# Patient Record
Sex: Female | Born: 2000
Health system: Southern US, Community
[De-identification: ages and names within clinical notes are randomized; demographics above are authoritative.]

## PROBLEM LIST (undated history)

## (undated) DIAGNOSIS — F419 Anxiety disorder, unspecified: Secondary | ICD-10-CM

## (undated) DIAGNOSIS — K219 Gastro-esophageal reflux disease without esophagitis: Secondary | ICD-10-CM

## (undated) DIAGNOSIS — L732 Hidradenitis suppurativa: Secondary | ICD-10-CM

## (undated) DIAGNOSIS — F319 Bipolar disorder, unspecified: Secondary | ICD-10-CM

## (undated) DIAGNOSIS — I10 Essential (primary) hypertension: Secondary | ICD-10-CM

## (undated) DIAGNOSIS — F32A Depression, unspecified: Secondary | ICD-10-CM

## (undated) DIAGNOSIS — E282 Polycystic ovarian syndrome: Secondary | ICD-10-CM

## (undated) DIAGNOSIS — K589 Irritable bowel syndrome without diarrhea: Secondary | ICD-10-CM

## (undated) DIAGNOSIS — F4310 Post-traumatic stress disorder, unspecified: Secondary | ICD-10-CM

## (undated) DIAGNOSIS — F603 Borderline personality disorder: Secondary | ICD-10-CM

## (undated) DIAGNOSIS — F329 Major depressive disorder, single episode, unspecified: Secondary | ICD-10-CM

## (undated) DIAGNOSIS — D649 Anemia, unspecified: Secondary | ICD-10-CM

## (undated) HISTORY — DX: Irritable bowel syndrome, unspecified: K58.9

## (undated) HISTORY — DX: Post-traumatic stress disorder, unspecified: F43.10

## (undated) HISTORY — DX: Gastro-esophageal reflux disease without esophagitis: K21.9

## (undated) HISTORY — DX: Anxiety disorder, unspecified: F41.9

## (undated) HISTORY — DX: Anemia, unspecified: D64.9

## (undated) HISTORY — DX: Hidradenitis suppurativa: L73.2

## (undated) HISTORY — PX: OTHER SURGICAL HISTORY: SHX169

---
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
## 2018-02-20 ENCOUNTER — Telehealth: Payer: No Typology Code available for payment source | Admitting: Family

## 2018-02-20 DIAGNOSIS — H60501 Unspecified acute noninfective otitis externa, right ear: Secondary | ICD-10-CM | POA: Diagnosis not present

## 2018-02-20 MED ORDER — CIPROFLOXACIN-DEXAMETHASONE 0.3-0.1 % OT SUSP: 4.0000 [drp] | Freq: Two times a day (BID) | OTIC | 0 refills | Status: DC

## 2018-02-20 NOTE — Progress Notes (Signed)
E Visit for Swimmer's Ear  We are sorry that you are not feeling well. Here is how we plan to help!  Based on what you have shared with me it looks like you have swimmers ear. Swimmer's ear is a redness or swelling, irritation, or infection of your outer ear canal.  These symptoms usually occur within a few days of swimming.  Your ear canal is a tube that goes from the opening of the ear to the eardrum.  When water stays in your ear canal, germs can grow.  This is a painful condition that often happens to children and swimmers of all ages.  It is not contagious and oral antibiotics are not required to treat uncomplicated swimmer's ear.  The usual symptoms include: Itching inside the ear, Redness or a sense of swelling in the ear, Pain when the ear is tugged on when pressure is placed on the ear, Pus draining from the infected ear. and I have prescribed: Ciprofloxin 0.2% and dexamethasone 1% otic suspension 4 drops in affected ears twice daily for 7 days    In certain cases swimmer's ear may progress to a more serious bacterial infection of the middle or inner ear.  If you have a fever 102 and up and significantly worsening symptoms, this could indicate a more serious infection moving to the middle/inner and needs face to face evaluation in an office by a provider.  Your symptoms should improve over the next 3 days and should resolve in about 7 days.  HOME CARE:   Wash your hands frequently.  Do not place the tip of the bottle on your ear or touch it with your fingers.  You can take Acetominophen 650 mg every 4-6 hours as needed for pain.  If pain is severe or moderate, you can apply a heating pad (set on low) or hot water bottle (wrapped in a towel) to outer ear for 20 minutes.  This will also increase drainage.  Avoid ear plugs  Do not use Q-tips  After showers, help the water run out by tilting your head to one side.  GET HELP RIGHT AWAY IF:   Fever is over 102.2 degrees.  You  develop progressive ear pain or hearing loss.  Ear symptoms persist longer than 3 days after treatment.  MAKE SURE YOU:   Understand these instructions.  Will watch your condition.  Will get help right away if you are not doing well or get worse.  TO PREVENT SWIMMER'S EAR:  Use a bathing cap or custom fitted swim molds to keep your ears dry.  Towel off after swimming to dry your ears.  Tilt your head or pull your earlobes to allow the water to escape your ear canal.  If there is still water in your ears, consider using a hairdryer on the lowest setting.  Thank you for choosing an e-visit. Your e-visit answers were reviewed by a board certified advanced clinical practitioner to complete your personal care plan. Depending upon the condition, your plan could have included both over the counter or prescription medications. Please review your pharmacy choice. Be sure that the pharmacy you have chosen is open so that you can pick up your prescription now.  If there is a problem you may message your provider in [place] to have the prescription routed to another pharmacy. Your safety is important to [place]. If you have drug allergies check your prescription carefully.  For the next 24 hours, you can use MyChart to ask questions about  today's visit, request a non-urgent call back, or ask for a work or school excuse from your e-visit provider. You will get an email in the next two days asking about your experience. I hope that your e-visit has been valuable and will speed your recovery.

## 2018-03-02 ENCOUNTER — Telehealth: Payer: Medicaid Other | Admitting: Physician Assistant

## 2018-03-02 DIAGNOSIS — R112 Nausea with vomiting, unspecified: Secondary | ICD-10-CM

## 2018-03-02 DIAGNOSIS — R1013 Epigastric pain: Secondary | ICD-10-CM

## 2018-03-02 NOTE — Progress Notes (Signed)
Based on what you shared with me it looks like you have a serious condition that should be evaluated in a face to face office visit.  NOTE: If you entered your credit card information for this eVisit, you will not be charged. You may see a "hold" on your card for the $30 but that hold will drop off and you will not have a charge processed.  If you are having a true medical emergency please call 911.  If you need an urgent face to face visit, [facility] has four urgent care centers for your convenience.  If you need care fast and have a high deductible or no insurance consider:   [email or web address].[email or web address] to reserve your spot online an avoid wait times  [facility] [address], [address] [place], [place] [address] 8 am to 8 pm Monday-Friday 10 am to 4 pm Saturday-Sunday *Across the street from [employer]  [facility]  [address]  [place], [address] 8 am to 5 pm Monday-Friday * In the [facility]   The following sites will take your  insurance:  . [facility]  [phone] Get Driving Directions Find a Provider at this Location  [address] [place], [place] [address] . 10 am to 8 pm Monday-Friday . 12 pm to 8 pm Saturday-Sunday   . [facility]  [phone] Get Driving Directions Find a Provider at this Location  [address], [address] [place], [place] [address] . 8 am to 8 pm Monday-Friday . 9 am to 6 pm Saturday . 11 am to 6 pm Sunday   . [facility] [place]  [phone] Get Driving Directions  [address] [place], [place] [address] . 8 am to 8 pm Monday-Friday . 8 am to 4 pm Saturday-Sunday   Your e-visit answers were reviewed by a board certified advanced clinical practitioner to complete your personal care plan.  Thank you for using e-Visits.  

## 2018-03-02 NOTE — Progress Notes (Signed)
I have spent 5 minutes in reviewing chart, decision making and responding to patient.

## 2018-08-17 ENCOUNTER — Other Ambulatory Visit: Payer: Self-pay

## 2018-08-17 ENCOUNTER — Encounter: Payer: Self-pay | Admitting: *Deleted

## 2018-08-17 ENCOUNTER — Ambulatory Visit
Admission: EM | Admit: 2018-08-17 | Discharge: 2018-08-17 | Disposition: A | Discharge status: Home / Self Care | Payer: No Typology Code available for payment source | Attending: Emergency Medicine | Admitting: Emergency Medicine

## 2018-08-17 DIAGNOSIS — R591 Generalized enlarged lymph nodes: Secondary | ICD-10-CM | POA: Diagnosis not present

## 2018-08-17 DIAGNOSIS — J029 Acute pharyngitis, unspecified: Secondary | ICD-10-CM | POA: Diagnosis present

## 2018-08-17 DIAGNOSIS — R6889 Other general symptoms and signs: Secondary | ICD-10-CM | POA: Diagnosis present

## 2018-08-17 DIAGNOSIS — Z20822 Contact with and (suspected) exposure to covid-19: Secondary | ICD-10-CM

## 2018-08-17 DIAGNOSIS — Z20828 Contact with and (suspected) exposure to other viral communicable diseases: Secondary | ICD-10-CM

## 2018-08-17 DIAGNOSIS — R1031 Right lower quadrant pain: Secondary | ICD-10-CM | POA: Diagnosis not present

## 2018-08-17 LAB — POCT RAPID STREP A (OFFICE): Rapid Strep A Screen: NEGATIVE

## 2018-08-17 MED ORDER — AMOXICILLIN 500 MG PO CAPS: 500.0000 mg | ORAL_CAPSULE | Freq: Two times a day (BID) | ORAL | 0 refills | Status: AC

## 2018-08-17 MED ORDER — FLUCONAZOLE 200 MG PO TABS: ORAL_TABLET | ORAL | 0 refills | Status: DC

## 2018-08-17 NOTE — Discharge Instructions (Addendum)
Strep test negative, will send out for culture and we will call you with results Push fluids and get rest Prescribed amoxicillin 500mg  twice daily for 10 days.  Take as directed and to completion.  Drink warm or cool liquids, use throat lozenges, or popsicles to help alleviate symptoms Take OTC ibuprofen or tylenol as needed for pain  In the meantime: You should remain isolated in your home for 7 days from symptom onset AND greater than 72 hours after symptoms resolution (absence of fever without the use of fever-reducing medication and improvement in respiratory symptoms), whichever is longer Call or go to the ED if you have any new or worsening symptoms such as fever, worsening cough, shortness of breath, chest tightness, chest pain, turning blue, changes in mental status, etc..Marland Kitchen

## 2018-08-17 NOTE — ED Triage Notes (Signed)
Reports starting with knot in right neck over past 2 days with slight soreness.  Denies sore throat.  Has been taking IBU.  Denies fevers. States gets tested Q 2 wks for Covid for job; had most recent swab done 2 days ago.

## 2018-08-17 NOTE — ED Provider Notes (Addendum)
[facility]   [id number] 08/17/18 Arrival Time: 8469  CC: SORE THROAT  SUBJECTIVE: History from: patient.  Cynthia Ross is a 18 y.o. female who presents with sore throat, and RT sided LAD x 1-2 days.  Denies sick exposure to COVID, flu or strep.  Denies recent travel.  Works with the [employer].  Has tried OTC antihistamine with minimal relief.  Symptoms are made worse with swallowing, but tolerating liquids and own secretions without difficulty.  Reports previous symptoms in the past with strep.   Denies fever, chills, fatigue, ear pain, sinus pain, rhinorrhea, nasal congestion, cough, SOB, wheezing, chest pain, chest pressure, nausea, rash, changes in bowel or bladder habits.    ROS: As per HPI.  All other pertinent ROS negative.     Past Medical History:  Diagnosis Date  . Anxiety   . Depression   . GERD (gastroesophageal reflux disease)   . IBS (irritable bowel syndrome)    Past Surgical History:  Procedure Laterality Date  . None     Allergies  Allergen Reactions  . Other     Pt cannot remember which medicine makes her itch   No current facility-administered medications on file prior to encounter.    Current Outpatient Medications on File Prior to Encounter  Medication Sig Dispense Refill  . DIPHENHYDRAMINE CITRATE PO Take by mouth.    . famotidine (PEPCID) 20 MG tablet Take 20 mg by mouth daily.    Marland Kitchen LORazepam (ATIVAN) 1 MG tablet Take 1 mg by mouth 2 (two) times daily.    . ciprofloxacin-dexamethasone (CIPRODEX) OTIC suspension Place 4 drops into the right ear 2 (two) times daily. 7.5 mL 0  . hydrOXYzine (ATARAX/VISTARIL) 25 MG tablet Take 25 mg by mouth at bedtime.     Marland Kitchen ibuprofen (ADVIL,MOTRIN) 600 MG tablet Take 1 tablet (600 mg total) by mouth 4 (four) times daily. 30 tablet 0  . norgestimate-ethinyl estradiol (SPRINTEC 28) 0.25-35 MG-MCG tablet Take 1 tablet by mouth daily. 84 tablet 4  . pantoprazole (PROTONIX) 40 MG tablet Take 40 mg by mouth  at bedtime.    . [DISCONTINUED] hyoscyamine (LEVSIN, ANASPAZ) 0.125 MG tablet Take 1 tablet (0.125 mg total) by mouth every 6 (six) hours as needed. (Patient not taking: Reported on 12/16/2017) 30 tablet 1   Social History   Socioeconomic History  . Marital status: Single    Spouse name: Not on file  . Number of children: Not on file  . Years of education: Not on file  . Highest education level: Not on file  Occupational History  . Not on file  Social Needs  . Financial resource strain: Not on file  . Food insecurity    Worry: Not on file    Inability: Not on file  . Transportation needs    Medical: Not on file    Non-medical: Not on file  Tobacco Use  . Smoking status: Never Smoker  . Smokeless tobacco: Never Used  Substance and Sexual Activity  . Alcohol use: No  . Drug use: No  . Sexual activity: Yes    Birth control/protection: None  Lifestyle  . Physical activity    Days per week: Not on file    Minutes per session: Not on file  . Stress: Not on file  Relationships  . Social Herbalist on phone: Not on file    Gets together: Not on file    Attends religious service: Not on file  Active member of club or organization: Not on file    Attends meetings of clubs or organizations: Not on file    Relationship status: Not on file  . Intimate partner violence    Fear of current or ex partner: Not on file    Emotionally abused: Not on file    Physically abused: Not on file    Forced sexual activity: Not on file  Other Topics Concern  . Not on file  Social History Narrative  . Not on file   Family History  Problem Relation Age of Onset  . Depression Father   . Irritable bowel syndrome Father   . Mental illness Mother   . Cancer Mother        ovary  . Endometriosis Mother   . COPD Maternal Grandmother   . Cancer Maternal Grandmother        ovary  . Colon cancer Neg Hx     OBJECTIVE:  Vitals:   08/17/18 1446  BP: 129/78  Pulse: (!) 112   Resp: 16  Temp: 97.9 F (36.6 C)  TempSrc: Temporal  SpO2: 97%    General appearance: alert; well-appearing, nontoxic, speaking in full sentences and managing own secretions HEENT: NCAT; Ears: EACs clear, TMs pearly gray with visible cone of light, without erythema; Eyes: PERRL, EOMI grossly; Nose: no obvious rhinorrhea; Throat: oropharynx clear, tonsils 1+ and erythematous without white tonsillar exudates, uvula midline Neck:  Lungs: CTA bilaterally without adventitious breath sounds; cough absent Heart: regular rate and rhythm.   Skin: warm and dry Psychological: alert and cooperative; normal mood and affect  LABS: Results for orders placed or performed during the hospital encounter of 08/17/18 (from the past 24 hour(s))  POCT rapid strep A     Status: None   Collection Time: 08/17/18  3:20 PM  Result Value Ref Range   Rapid Strep A Screen Negative Negative    ASSESSMENT & PLAN:  1. Acute pharyngitis, unspecified etiology   2. Lymphadenopathy   3. Suspected Covid-19 Virus Infection     Meds ordered this encounter  Medications  . amoxicillin (AMOXIL) 500 MG capsule    Sig: Take 1 capsule (500 mg total) by mouth 2 (two) times daily for 10 days.    Dispense:  20 capsule    Refill:  0    Order Specific Question:   Supervising Provider    Answer:   Raylene Everts [[id number]]  . fluconazole (DIFLUCAN) 200 MG tablet    Sig: Take one dose by mouth, wait 72 hours, and then take second dose by mouth    Dispense:  2 tablet    Refill:  0    Order Specific Question:   Supervising Provider    Answer:   Raylene Everts [[id number]]   Strep test negative, will send out for culture and we will call you with results.  However, based on exam and symptoms will go ahead and treat for strep.   Push fluids and get rest Prescribed amoxicillin 500mg  twice daily for 10 days.  Take as directed and to completion.  Drink warm or cool liquids, use throat lozenges, or popsicles to help alleviate  symptoms Take OTC ibuprofen or tylenol as needed for pain  In the meantime: You should remain isolated in your home for 7 days from symptom onset AND greater than 72 hours after symptoms resolution (absence of fever without the use of fever-reducing medication and improvement in respiratory symptoms), whichever is longer Call or go  to the ED if you have any new or worsening symptoms such as fever, worsening cough, shortness of breath, chest tightness, chest pain, turning blue, changes in mental status, etc...   Reviewed expectations re: course of current medical issues. Questions answered. Outlined signs and symptoms indicating need for more acute intervention. Patient verbalized understanding. After Visit Summary given.        Lestine Box, PA-C 08/17/18 Byron, Dawson, PA-C 08/17/18 1545

## 2018-08-20 LAB — CULTURE, GROUP A STREP (THRC)

## 2018-09-11 ENCOUNTER — Encounter: Payer: Self-pay | Admitting: General Surgery

## 2018-09-15 ENCOUNTER — Telehealth: Payer: No Typology Code available for payment source | Admitting: Physician Assistant

## 2018-09-15 DIAGNOSIS — B349 Viral infection, unspecified: Secondary | ICD-10-CM | POA: Diagnosis not present

## 2018-09-15 MED ORDER — BENZONATATE 100 MG PO CAPS: 100.0000 mg | ORAL_CAPSULE | Freq: Two times a day (BID) | ORAL | 0 refills | Status: DC | PRN

## 2018-09-15 NOTE — Progress Notes (Addendum)
We are sorry you are not feeling well.  Here is how we plan to help!  Based on what you have shared with me, it looks like you may have a viral infection. Viral infections are caused by a large number of viruses; however, rhinovirus is the most common cause.   Symptoms vary from person to person, with common symptoms including sore throat, cough, and fatigue or lack of energy.  A low-grade fever of up to 100.4 may present, but is often uncommon.  Symptoms vary however, and are closely related to a person's age or underlying illnesses.  The most common symptoms associated with an upper respiratory infection are nasal discharge or congestion, cough, sneezing, headache and pressure in the ears and face.  These symptoms usually persist for about 3 to 10 days, but can last up to 2 weeks.  It is important to know that upper respiratory infections do not cause serious illness or complications in most cases.    Upper respiratory infections can be transmitted from person to person, with the most common method of transmission being a person's hands.  The virus is able to live on the skin and can infect other persons for up to 2 hours after direct contact.  Also, these can be transmitted when someone coughs or sneezes; thus, it is important to cover the mouth to reduce this risk.  To keep the spread of the illness at [place], good hand hygiene is very important.  This is an infection that is most likely caused by a virus. There are no specific treatments other than to help you with the symptoms until the infection runs its course.  We are sorry you are not feeling well.  Here is how we plan to help!   For nasal congestion, you may use an oral decongestants such as Mucinex D or if you have glaucoma or high blood pressure use plain Mucinex.  Saline nasal spray or nasal drops can help and can safely be used as often as needed for congestion.  For your congestion please use saline nasal rinses. I have sent a prescription  for Tessalon to your pharmacy for cough as needed. You may continue to use over the counter medications as directed.  If you do not have a history of heart disease, hypertension, diabetes or thyroid disease, prostate/bladder issues or glaucoma, you may also use Sudafed to treat nasal congestion.  It is highly recommended that you consult with a pharmacist or your primary care physician to ensure this medication is safe for you to take.     If you have a cough, you may use cough suppressants such as Delsym and Robitussin.  If you have glaucoma or high blood pressure, you can also use Coricidin HBP.   For cough I have prescribed for you A prescription cough medication called Tessalon Perles 100 mg. You may take 1-2 capsules every 8 hours as needed for cough  If you have a sore or scratchy throat, use a saltwater gargle-  to  teaspoon of salt dissolved in a 4-ounce to 8-ounce glass of warm water.  Gargle the solution for approximately 15-30 seconds and then spit.  It is important not to swallow the solution.  You can also use throat lozenges/cough drops and Chloraseptic spray to help with throat pain or discomfort.  Warm or cold liquids can also be helpful in relieving throat pain.  For headache, pain or general discomfort, you can use Ibuprofen or Tylenol as directed.   Some authorities  believe that zinc sprays or the use of Echinacea may shorten the course of your symptoms.   HOME CARE . Only take medications as instructed by your medical team. . Be sure to drink plenty of fluids. Water is fine as well as fruit juices, sodas and electrolyte beverages. You may want to stay away from caffeine or alcohol. If you are nauseated, try taking small sips of liquids. How do you know if you are getting enough fluid? Your urine should be a pale yellow or almost colorless. . Get rest. . Taking a steamy shower or using a humidifier may help nasal congestion and ease sore throat pain. You can place a towel over  your head and breathe in the steam from hot water coming from a faucet. . Using a saline nasal spray works much the same way. . Cough drops, hard candies and sore throat lozenges may ease your cough. . Avoid close contacts especially the very young and the elderly . Cover your mouth if you cough or sneeze . Always remember to wash your hands.   GET HELP RIGHT AWAY IF: . You develop worsening fever. . If your symptoms do not improve within 10 days . You develop yellow or green discharge from your nose over 3 days. . You have coughing fits . You develop a severe head ache or visual changes. . You develop shortness of breath, difficulty breathing or start having chest pain . Your symptoms persist after you have completed your treatment plan  MAKE SURE YOU   Understand these instructions.  Will watch your condition.  Will get help right away if you are not doing well or get worse.  Your e-visit answers were reviewed by a board certified advanced clinical practitioner to complete your personal care plan. Depending upon the condition, your plan could have included both over the counter or prescription medications. Please review your pharmacy choice. If there is a problem, you may call our nursing hot line at and have the prescription routed to another pharmacy. Your safety is important to [place]. If you have drug allergies check your prescription carefully.   You can use MyChart to ask questions about today's visit, request a non-urgent call back, or ask for a work or school excuse for 24 hours related to this e-Visit. If it has been greater than 24 hours you will need to follow up with your provider, or enter a new e-Visit to address those concerns. You will get an e-mail in the next two days asking about your experience.  I hope that your e-visit has been valuable and will speed your recovery. Thank you for using e-visits.    Greater than 5 minutes, yet less than 10 minutes of time have been  spent researching, coordinating, and implementing care for this patient today

## 2018-10-24 ENCOUNTER — Ambulatory Visit: Payer: No Typology Code available for payment source | Admitting: General Surgery

## 2019-12-04 ENCOUNTER — Emergency Department (HOSPITAL_COMMUNITY)
Admission: EM | Admit: 2019-12-04 | Discharge: 2019-12-04 | Disposition: A | Discharge status: Home / Self Care | Payer: Self-pay | Attending: Emergency Medicine | Admitting: Emergency Medicine

## 2019-12-04 ENCOUNTER — Encounter (HOSPITAL_COMMUNITY): Payer: Self-pay

## 2019-12-04 ENCOUNTER — Emergency Department (HOSPITAL_COMMUNITY): Payer: Self-pay

## 2019-12-04 ENCOUNTER — Other Ambulatory Visit: Payer: Self-pay

## 2019-12-04 DIAGNOSIS — R079 Chest pain, unspecified: Secondary | ICD-10-CM | POA: Insufficient documentation

## 2019-12-04 DIAGNOSIS — R202 Paresthesia of skin: Secondary | ICD-10-CM | POA: Insufficient documentation

## 2019-12-04 DIAGNOSIS — Z79899 Other long term (current) drug therapy: Secondary | ICD-10-CM | POA: Insufficient documentation

## 2019-12-04 DIAGNOSIS — I10 Essential (primary) hypertension: Secondary | ICD-10-CM | POA: Insufficient documentation

## 2019-12-04 DIAGNOSIS — F17210 Nicotine dependence, cigarettes, uncomplicated: Secondary | ICD-10-CM | POA: Insufficient documentation

## 2019-12-04 HISTORY — DX: Essential (primary) hypertension: I10

## 2019-12-04 LAB — COMPREHENSIVE METABOLIC PANEL
ALT: 17 U/L (ref 0–44)
AST: 20 U/L (ref 15–41)
Albumin: 4.6 g/dL (ref 3.5–5.0)
Alkaline Phosphatase: 49 U/L (ref 38–126)
Anion gap: 9 (ref 5–15)
BUN: 10 mg/dL (ref 6–20)
CO2: 23 mmol/L (ref 22–32)
Calcium: 9.1 mg/dL (ref 8.9–10.3)
Chloride: 104 mmol/L (ref 98–111)
Creatinine, Ser: 0.6 mg/dL (ref 0.44–1.00)
GFR, Estimated: >60 mL/min (ref >60)
Glucose, Bld: 93 mg/dL (ref 70–99)
Potassium: 3.3 mmol/L — ABNORMAL LOW (ref 3.5–5.1)
Sodium: 136 mmol/L (ref 135–145)
Total Bilirubin: 0.4 mg/dL (ref 0.3–1.2)
Total Protein: 7.8 g/dL (ref 6.5–8.1)

## 2019-12-04 LAB — CBC WITH DIFFERENTIAL/PLATELET
Abs Immature Granulocytes: 0.03 10*3/uL (ref 0.00–0.07)
Basophils Absolute: 0 10*3/uL (ref 0.0–0.1)
Basophils Relative: 1 %
Eosinophils Absolute: 0.1 10*3/uL (ref 0.0–0.5)
Eosinophils Relative: 2 %
HCT: 40.7 % (ref 36.0–46.0)
Hemoglobin: 13.6 g/dL (ref 12.0–15.0)
Immature Granulocytes: 0 %
Lymphocytes Relative: 26 %
Lymphs Abs: 2.1 10*3/uL (ref 0.7–4.0)
MCH: 27.8 pg (ref 26.0–34.0)
MCHC: 33.4 g/dL (ref 30.0–36.0)
MCV: 83.2 fL (ref 80.0–100.0)
Monocytes Absolute: 0.5 10*3/uL (ref 0.1–1.0)
Monocytes Relative: 6 %
Neutro Abs: 5.4 10*3/uL (ref 1.7–7.7)
Neutrophils Relative %: 65 %
Platelets: 223 10*3/uL (ref 150–400)
RBC: 4.89 MIL/uL (ref 3.87–5.11)
RDW: 12.3 % (ref 11.5–15.5)
WBC: 8.1 10*3/uL (ref 4.0–10.5)
nRBC: 0 % (ref 0.0–0.2)

## 2019-12-04 MED ORDER — POTASSIUM CHLORIDE CRYS ER 20 MEQ PO TBCR
20.0000 meq | EXTENDED_RELEASE_TABLET | Freq: Once | ORAL | Status: AC
  Administered 2019-12-04: 20 meq via ORAL
  Filled 2019-12-04: qty 1

## 2019-12-04 NOTE — ED Notes (Signed)
Pt ambulated to restroom and back to room.

## 2019-12-04 NOTE — Discharge Instructions (Signed)
Your history, physical exam, and work-up today was entirely reassuring.  I have very low suspicion for a cardiac event today.  Your chest pain is nonspecific, perhaps related to reflux.  I recommend that you take a Gas-X or Tums the next time you experience similar symptoms.  As for your tingling/paresthesias sensation, please read the attachment.  I suspect in your case that there is an anxiety component, however given your restricted diet, perhaps there is some vitamin derangement such as vitamin B6.  I encourage you to eat a more expensive diet.  It is vitally important that you get established with a primary care provider for ongoing evaluation and management of wellbeing.  I feel as though your mental health is being under addressed.  Please call the [facility] to get established with a primary care provider.  You may also go to the Department of Health locally in [place] or [place].  I have also provided an attachment on local counseling options for people with mental health disease.  Please review it.     Return to the ER seek immediate medical attention should you experience any new or worsening symptoms.

## 2019-12-04 NOTE — ED Provider Notes (Signed)
[facility] EMERGENCY DEPARTMENT Provider Note   CSN: [id number] Arrival date & time: 12/04/19  1930     History Chief Complaint  Patient presents with  . Chest Pain    Cynthia Ross is a 19 y.o. female with PMH of GERD, anxiety, and depression who presents the ED with complaints of numbness and chest pain.  On my examination, patient tells me that she has had nothing to eat for the past few weeks aside from [place] fries.  She tells me she has problems with her mental health and is not receiving any outpatient treatment.  She states that she had a pediatrician that has had her on benzodiazepines since she was 19 years old.  She does not take any other medications regularly.  She states that she no longer has any insurance and has not seen a psychiatrist or other mental health professional since she was a child.  Today, she feels as though her left arm has been tingly, cold, and "heavy".  She also has been having chest pain intermittently throughout the day.  She was told by her old pediatrician that she was borderline hyperlipidemic and might need to be placed on statin medications.  She is concerned that perhaps given her recent diet that she is having a heart attack.  She states that she would not be able to sleep until she was evaluated in the ER.  Her grandmother encouraged to come in for assessment.  She was at work when her chest pain began.  She works as a Scientist, water quality.  She denies any exertional chest pain, shortness of breath, recent illness or infection, recent fevers or chills, nausea or vomiting, diaphoresis, family history of premature cardiac death, or other symptoms.  She smokes a cigarette, every once in a while, but nothing study.  No other illicit drug use.  She denies any history of IVDA.  She also denies possibility of pregnancy.  HPI     Past Medical History:  Diagnosis Date  . Anxiety   . Depression   . GERD (gastroesophageal reflux disease)   . Hypertension   . IBS  (irritable bowel syndrome)     Patient Active Problem List   Diagnosis Date Noted  . Abdominal pain of multiple sites 03/11/2017  . Irritable bowel syndrome with both constipation and diarrhea 12/14/2016  . Dyspareunia in female 12/14/2016  . Dysmenorrhea 12/14/2016  . Yeast infection of the vagina 12/14/2016  . Vaginal discharge 12/14/2016  . Depression 09/22/2012  . Generalized anxiety disorder 09/22/2012  . Family history of bipolar disorder 09/22/2012  . Family history of depression 09/22/2012  . Lice 26/71/2458  . Dyspepsia 09/08/2012  . GERD (gastroesophageal reflux disease) 09/08/2012    Past Surgical History:  Procedure Laterality Date  . None       OB History    Gravida  0   Para  0   Term  0   Preterm  0   AB  0   Living  0     SAB  0   TAB  0   Ectopic  0   Multiple  0   Live Births  0           Family History  Problem Relation Age of Onset  . Depression Father   . Irritable bowel syndrome Father   . Mental illness Mother   . Cancer Mother        ovary  . Endometriosis Mother   . COPD Maternal  Grandmother   . Cancer Maternal Grandmother        ovary  . Colon cancer Neg Hx     Social History   Tobacco Use  . Smoking status: Current Every Day Smoker    Packs/day: 1.00    Types: Cigarettes  . Smokeless tobacco: Never Used  Vaping Use  . Vaping Use: Never used  Substance Use Topics  . Alcohol use: Yes    Comment: 4 days a week  . Drug use: Yes    Types: Marijuana    Home Medications Prior to Admission medications   Medication Sig Start Date End Date Taking? Authorizing Provider  loperamide (IMODIUM A-D) 2 MG tablet Take 2 mg by mouth 4 (four) times daily as needed for diarrhea or loose stools.   Yes [provider]  LORazepam (ATIVAN) 1 MG tablet Take 1 mg by mouth 2 (two) times daily.   Yes [provider]  ondansetron (ZOFRAN) 4 MG tablet Take 4 mg by mouth 3 (three) times daily as needed. 11/09/19   Yes [provider]  Simethicone (GAS-X PO) Take 1 tablet by mouth daily.   Yes [provider]  benzonatate (TESSALON) 100 MG capsule Take 1 capsule (100 mg total) by mouth 2 (two) times daily as needed for cough. 09/15/18   Hedges, Dellis Filbert, PA-C  ciprofloxacin-dexamethasone (CIPRODEX) OTIC suspension Place 4 drops into the right ear 2 (two) times daily. 02/20/18   Sharion Balloon, FNP  DIPHENHYDRAMINE CITRATE PO Take by mouth.    [provider]  fluconazole (DIFLUCAN) 200 MG tablet Take one dose by mouth, wait 72 hours, and then take second dose by mouth 08/17/18   Wurst, [place], PA-C  hydrOXYzine (ATARAX/VISTARIL) 25 MG tablet Take 25 mg by mouth at bedtime.     [provider]  ibuprofen (ADVIL,MOTRIN) 600 MG tablet Take 1 tablet (600 mg total) by mouth 4 (four) times daily. 10/05/17   Lily Kocher, PA-C  norgestimate-ethinyl estradiol (SPRINTEC 28) 0.25-35 MG-MCG tablet Take 1 tablet by mouth daily. 12/16/17   Estill Dooms, NP  pantoprazole (PROTONIX) 40 MG tablet Take 40 mg by mouth at bedtime.    [provider]  hyoscyamine (LEVSIN, ANASPAZ) 0.125 MG tablet Take 1 tablet (0.125 mg total) by mouth every 6 (six) hours as needed. Patient not taking: Reported on 12/16/2017 12/14/16 08/17/18  Estill Dooms, NP    Allergies    Other  Review of Systems   Review of Systems  All other systems reviewed and are negative.   Physical Exam Updated Vital Signs BP (!) 126/49 (BP Location: Right Arm)   Pulse 87   Temp 98.4 F (36.9 C) (Oral)   Resp 18   Ht 5\' 3"  (1.6 m)   Wt 83.9 kg   LMP 11/20/2019 (Within Days)   SpO2 99%   BMI 32.77 kg/m   Physical Exam Vitals and nursing note reviewed. Exam conducted with a chaperone present.  Constitutional:      General: She is not in acute distress.    Appearance: Normal appearance. She is not ill-appearing.  HENT:     Head: Normocephalic and atraumatic.  Eyes:     General: No  scleral icterus.    Conjunctiva/sclera: Conjunctivae normal.  Cardiovascular:     Rate and Rhythm: Normal rate and regular rhythm.     Pulses: Normal pulses.     Heart sounds: Normal heart sounds.  Pulmonary:     Effort: Pulmonary effort is normal. No  respiratory distress.     Breath sounds: Normal breath sounds. No stridor. No wheezing, rhonchi or rales.  Chest:     Chest wall: No tenderness.  Abdominal:     General: Abdomen is flat. There is no distension.     Palpations: Abdomen is soft.     Tenderness: There is no abdominal tenderness.  Musculoskeletal:     Right lower leg: No edema.     Left lower leg: No edema.  Skin:    General: Skin is dry.     Capillary Refill: Capillary refill takes less than 2 seconds.  Neurological:     General: No focal deficit present.     Mental Status: She is alert and oriented to person, place, and time.     GCS: GCS eye subscore is 4. GCS verbal subscore is 5. GCS motor subscore is 6.     Cranial Nerves: No cranial nerve deficit.     Sensory: No sensory deficit.     Motor: No weakness.     Coordination: Coordination normal.     Gait: Gait normal.  Psychiatric:        Mood and Affect: Mood normal.        Behavior: Behavior normal.        Thought Content: Thought content normal.     ED Results / Procedures / Treatments   Labs (all labs ordered are listed, but only abnormal results are displayed) Labs Reviewed  COMPREHENSIVE METABOLIC PANEL - Abnormal; Notable for the following components:      Result Value   Potassium 3.3 (*)    All other components within normal limits  CBC WITH DIFFERENTIAL/PLATELET    EKG EKG Interpretation  Date/Time:  Tuesday December 04 2019 19:49:54 EST Ventricular Rate:  93 PR Interval:    QRS Duration: 110 QT Interval:  356 QTC Calculation: 443 R Axis:   54 Text Interpretation: Sinus rhythm Confirmed by Milton Ferguson [phone]) on 12/04/2019 8:54:41 PM   Radiology DG Chest Portable 1 View  Result  Date: 12/04/2019 CLINICAL DATA:  Chest pain, numbness. Intermittent chest pain with shortness of breath current smoker, history of hypertension EXAM: PORTABLE CHEST 1 VIEW COMPARISON:  Radiograph 06/02/2004 FINDINGS: No consolidation, features of edema, pneumothorax, or effusion. Pulmonary vascularity is normally distributed. The cardiomediastinal contours are unremarkable. No acute osseous or soft tissue abnormality. Telemetry leads overlie the chest. IMPRESSION: No acute cardiopulmonary abnormality. Electronically Signed   By: Lovena Le M.D.   On: 12/04/2019 21:19    Procedures Procedures (including critical care time)  Medications Ordered in ED Medications  potassium chloride SA (KLOR-CON) CR tablet 20 mEq (has no administration in time range)    ED Course  I have reviewed the triage vital signs and the nursing notes.  Pertinent labs & imaging results that were available during my care of the patient were reviewed by me and considered in my medical decision making (see chart for details).    MDM Rules/Calculators/A&P                          Patient's history of physical exam is entirely unremarkable.  She denies any history of premature cardiac death.  She is concerned for heart attack given that she experienced intermittent episodes of central chest discomfort that lasted approximately 3 to 5 minutes while she was at work today.  She also states that she has been having some mild tingling and a "heavy" feeling in her  left arm and sometimes left leg.  She denies any history of IVDA.  She states she been feeling fine with no recent fevers, chills, recent illness or infection.  However, given that she has had a markedly poor diet and has only been eating [place] fries x3 weeks, will obtain basic laboratory work-up to ensure that she is not anemic or with any electrolyte derangement.  I discussed with her why I have very low suspicion for MI and do not feel as though troponin is warranted.   This is very atypical.  More likely related to gas.  Encouraged her to take a abortive therapy such as Gas-X or Tums should this occur again.  Her EKG was normal sinus rhythm and her chest x-ray was personally reviewed and without any evidence of acute cardiopulmonary disease.  Perhaps her tingling sensation in her left arm is related to B6 deficiency, particularly in setting of poor diet.  She objectively really does not have any numbness or weakness.  Her physical exam is entirely normal.  She was reassured by today's work-up, but does note that she would have like to have her lipids checked.  I encouraged outpatient follow-up with a primary care provider.  We will refer her to [facility] health committee health and wellness for ongoing evaluation and management of her health wellbeing.  I also encouraged her to follow-up with the Department of Health in [place] or [place], [place] if she would prefer to go there.  She states that she has been prescribed Ativan 3 times daily x7 years for anxiety and has not actually pursued any therapy.  I feel as though her mental health is being poorly mismanaged.  We will provide her with additional resources.  Overall, she is reassured by today's comprehensive work-up.  She is understanding of the plan and is agreeable to discharge.  Final Clinical Impression(s) / ED Diagnoses Final diagnoses:  Nonspecific chest pain  Tingling in extremities    Rx / DC Orders ED Discharge Orders    None       Corena Herter, PA-C 12/04/19 2228    Milton Ferguson, MD 12/05/19 1217

## 2019-12-04 NOTE — ED Triage Notes (Signed)
Pt complaining of chest pain and arms tingling.  States she was at there doctor in March and he told her she had high cholesterol.  Pt anxious chest pain on and off, stressed out.

## 2020-01-16 ENCOUNTER — Emergency Department (HOSPITAL_COMMUNITY)
Admission: EM | Admit: 2020-01-16 | Discharge: 2020-01-17 | Disposition: A | Discharge status: Home / Self Care | Payer: Medicaid Other | Attending: Emergency Medicine | Admitting: Emergency Medicine

## 2020-01-16 ENCOUNTER — Encounter (HOSPITAL_COMMUNITY): Payer: Self-pay

## 2020-01-16 ENCOUNTER — Other Ambulatory Visit: Payer: Self-pay

## 2020-01-16 DIAGNOSIS — F329 Major depressive disorder, single episode, unspecified: Secondary | ICD-10-CM | POA: Insufficient documentation

## 2020-01-16 DIAGNOSIS — R45851 Suicidal ideations: Secondary | ICD-10-CM | POA: Insufficient documentation

## 2020-01-16 DIAGNOSIS — F1520 Other stimulant dependence, uncomplicated: Secondary | ICD-10-CM | POA: Insufficient documentation

## 2020-01-16 DIAGNOSIS — F17200 Nicotine dependence, unspecified, uncomplicated: Secondary | ICD-10-CM | POA: Insufficient documentation

## 2020-01-16 DIAGNOSIS — I10 Essential (primary) hypertension: Secondary | ICD-10-CM | POA: Insufficient documentation

## 2020-01-16 HISTORY — DX: Borderline personality disorder: F60.3

## 2020-01-16 HISTORY — DX: Bipolar disorder, unspecified: F31.9

## 2020-01-16 LAB — CBC
HCT: 41.5 % (ref 36.0–46.0)
Hemoglobin: 14 g/dL (ref 12.0–15.0)
MCH: 28.2 pg (ref 26.0–34.0)
MCHC: 33.7 g/dL (ref 30.0–36.0)
MCV: 83.5 fL (ref 80.0–100.0)
Platelets: 272 10*3/uL (ref 150–400)
RBC: 4.97 MIL/uL (ref 3.87–5.11)
RDW: 13 % (ref 11.5–15.5)
WBC: 7.9 10*3/uL (ref 4.0–10.5)
nRBC: 0 % (ref 0.0–0.2)

## 2020-01-16 LAB — COMPREHENSIVE METABOLIC PANEL
ALT: 20 U/L (ref 0–44)
AST: 22 U/L (ref 15–41)
Albumin: 4.5 g/dL (ref 3.5–5.0)
Alkaline Phosphatase: 50 U/L (ref 38–126)
Anion gap: 9 (ref 5–15)
BUN: 9 mg/dL (ref 6–20)
CO2: 22 mmol/L (ref 22–32)
Calcium: 9.2 mg/dL (ref 8.9–10.3)
Chloride: 107 mmol/L (ref 98–111)
Creatinine, Ser: 0.57 mg/dL (ref 0.44–1.00)
GFR, Estimated: >60 mL/min (ref >60)
Glucose, Bld: 91 mg/dL (ref 70–99)
Potassium: 3.5 mmol/L (ref 3.5–5.1)
Sodium: 138 mmol/L (ref 135–145)
Total Bilirubin: 0.3 mg/dL (ref 0.3–1.2)
Total Protein: 7.9 g/dL (ref 6.5–8.1)

## 2020-01-16 LAB — ACETAMINOPHEN LEVEL: Acetaminophen (Tylenol), Serum: <10 ug/mL — ABNORMAL LOW (ref 10–30)

## 2020-01-16 LAB — RAPID URINE DRUG SCREEN, HOSP PERFORMED
Amphetamines: NOT DETECTED
Barbiturates: NOT DETECTED
Benzodiazepines: POSITIVE — AB
Cocaine: NOT DETECTED
Opiates: NOT DETECTED
Tetrahydrocannabinol: NOT DETECTED

## 2020-01-16 LAB — ETHANOL: Alcohol, Ethyl (B): <10 mg/dL (ref <10)

## 2020-01-16 LAB — POC URINE PREG, ED: Preg Test, Ur: NEGATIVE

## 2020-01-16 LAB — SALICYLATE LEVEL: Salicylate Lvl: <7 mg/dL — ABNORMAL LOW (ref 7.0–30.0)

## 2020-01-16 MED ORDER — INFLUENZA VAC SPLIT QUAD 0.5 ML IM SUSY: 0.5000 mL | PREFILLED_SYRINGE | INTRAMUSCULAR | Status: DC

## 2020-01-16 NOTE — BH Assessment (Signed)
Cynthia Romp, NP, psych cleared. Patient has agreed to follow up with current therapist and medication management if needed.

## 2020-01-16 NOTE — ED Notes (Signed)
Telepsych monitor at bedside,awating consultation.

## 2020-01-16 NOTE — ED Notes (Signed)
Lab at bedside

## 2020-01-16 NOTE — ED Triage Notes (Signed)
Pt comes in pov by a friend states that she has PPD and Bipolar disorder. and states that since Thanksgiving she has been feeling depressed and just wanting to end it all. States that it comes in waves. Does not have a plan. States that lately she has been feeling numb and has cut all ties from her friends and family

## 2020-01-16 NOTE — ED Provider Notes (Signed)
[facility] EMERGENCY DEPARTMENT Provider Note   CSN: [id number] Arrival date & time: 01/16/20  2007     History Chief Complaint  Patient presents with  . Suicidal thoughts    Cynthia Ross is a 19 y.o. female.  HPI She presents for evaluation of suicidal ideation without suicidal plan.  She states she was at work tonight, felt stressed, when she needed some time off so decided to come here for evaluation.  She came with a friend who gave history that the patient was suicidal, to the triage nurse.  Patient reports history of similar thoughts, without ever having a suicide attempt.  She states that she does not take medication for bipolar disorder, but does see a therapist, over the Internet.  She denies use of illegal drugs or alcohol.  She states that she lives alone.  She denies recent illnesses.  There are no other known modifying factors.    Past Medical History:  Diagnosis Date  . Anxiety   . Bipolar depression ([place])   . Borderline personality disorder ([place])   . Depression   . GERD (gastroesophageal reflux disease)   . Hypertension   . IBS (irritable bowel syndrome)     Patient Active Problem List   Diagnosis Date Noted  . Abdominal pain of multiple sites 03/11/2017  . Irritable bowel syndrome with both constipation and diarrhea 12/14/2016  . Dyspareunia in female 12/14/2016  . Dysmenorrhea 12/14/2016  . Yeast infection of the vagina 12/14/2016  . Vaginal discharge 12/14/2016  . Depression 09/22/2012  . Generalized anxiety disorder 09/22/2012  . Family history of bipolar disorder 09/22/2012  . Family history of depression 09/22/2012  . Lice 86/76/1950  . Dyspepsia 09/08/2012  . GERD (gastroesophageal reflux disease) 09/08/2012    Past Surgical History:  Procedure Laterality Date  . None       OB History    Gravida  0   Para  0   Term  0   Preterm  0   AB  0   Living  0     SAB  0   IAB  0   Ectopic  0   Multiple  0   Live Births  0            Family History  Problem Relation Age of Onset  . Depression Father   . Irritable bowel syndrome Father   . Mental illness Mother   . Cancer Mother        ovary  . Endometriosis Mother   . COPD Maternal Grandmother   . Cancer Maternal Grandmother        ovary  . Colon cancer Neg Hx     Social History   Tobacco Use  . Smoking status: Current Every Day Smoker    Packs/day: 0.00  . Smokeless tobacco: Never Used  Vaping Use  . Vaping Use: Never used  Substance Use Topics  . Alcohol use: Yes    Comment: 4 days a week  . Drug use: Yes    Frequency: 1.0 times per week    Types: Marijuana    Home Medications Prior to Admission medications   Medication Sig Start Date End Date Taking? Authorizing Provider  benzonatate (TESSALON) 100 MG capsule Take 1 capsule (100 mg total) by mouth 2 (two) times daily as needed for cough. 09/15/18   Hedges, Dellis Filbert, PA-C  ciprofloxacin-dexamethasone (CIPRODEX) OTIC suspension Place 4 drops into the right ear 2 (two) times daily. 02/20/18   Hawks,  Christy A, FNP  DIPHENHYDRAMINE CITRATE PO Take by mouth.    [provider]  fluconazole (DIFLUCAN) 200 MG tablet Take one dose by mouth, wait 72 hours, and then take second dose by mouth 08/17/18   Wurst, [place], PA-C  hydrOXYzine (ATARAX/VISTARIL) 25 MG tablet Take 25 mg by mouth at bedtime.     [provider]  ibuprofen (ADVIL,MOTRIN) 600 MG tablet Take 1 tablet (600 mg total) by mouth 4 (four) times daily. 10/05/17   Lily Kocher, PA-C  loperamide (IMODIUM A-D) 2 MG tablet Take 2 mg by mouth 4 (four) times daily as needed for diarrhea or loose stools.    [provider]  LORazepam (ATIVAN) 1 MG tablet Take 1 mg by mouth 2 (two) times daily.    [provider]  norgestimate-ethinyl estradiol ([place] 28) 0.25-35 MG-MCG tablet Take 1 tablet by mouth daily. 12/16/17   Estill Dooms, NP  ondansetron (ZOFRAN) 4 MG tablet Take 4 mg by mouth 3 (three)  times daily as needed. 11/09/19   [provider]  pantoprazole (PROTONIX) 40 MG tablet Take 40 mg by mouth at bedtime.    [provider]  Simethicone (GAS-X PO) Take 1 tablet by mouth daily.    [provider]  hyoscyamine (LEVSIN, ANASPAZ) 0.125 MG tablet Take 1 tablet (0.125 mg total) by mouth every 6 (six) hours as needed. Patient not taking: Reported on 12/16/2017 12/14/16 08/17/18  Estill Dooms, NP    Allergies    Other  Review of Systems   Review of Systems  All other systems reviewed and are negative.   Physical Exam Updated Vital Signs BP (!) 144/83 (BP Location: Right Arm)   Pulse 98   Temp 98.6 F (37 C) (Oral)   Resp 16   SpO2 100%   Physical Exam Vitals and nursing note reviewed.  Constitutional:      General: She is not in acute distress.    Appearance: She is well-developed and well-nourished. She is not ill-appearing, toxic-appearing or diaphoretic.  HENT:     Head: Normocephalic and atraumatic.     Right Ear: External ear normal.     Left Ear: External ear normal.  Eyes:     Extraocular Movements: EOM normal.     Conjunctiva/sclera: Conjunctivae normal.     Pupils: Pupils are equal, round, and reactive to light.  Neck:     Trachea: Phonation normal.  Cardiovascular:     Rate and Rhythm: Normal rate and regular rhythm.     Heart sounds: Normal heart sounds.  Pulmonary:     Effort: Pulmonary effort is normal.     Breath sounds: Normal breath sounds.  Chest:     Chest wall: No bony tenderness.  Abdominal:     Palpations: Abdomen is soft.     Tenderness: There is no abdominal tenderness.  Musculoskeletal:        General: Normal range of motion.     Cervical back: Normal range of motion and neck supple.  Skin:    General: Skin is warm, dry and intact.  Neurological:     Mental Status: She is alert and oriented to person, place, and time.     Cranial Nerves: No cranial nerve deficit.     Sensory: No sensory  deficit.     Motor: No abnormal muscle tone.     Coordination: Coordination normal.  Psychiatric:        Attention and Perception: She is attentive.  Mood and Affect: Mood and affect and mood normal. Mood is not anxious, depressed or elated. Affect is not labile, flat or tearful.        Speech: She is communicative.        Behavior: Behavior is cooperative.        Thought Content: Thought content includes suicidal ideation. Thought content does not include homicidal ideation. Thought content does not include suicidal plan.        Cognition and Memory: Cognition is not impaired. Memory is not impaired.        Judgment: Judgment is inappropriate.     ED Results / Procedures / Treatments   Labs (all labs ordered are listed, but only abnormal results are displayed) Labs Reviewed  SALICYLATE LEVEL - Abnormal; Notable for the following components:      Result Value   Salicylate Lvl [id number] (*)    All other components within normal limits  ACETAMINOPHEN LEVEL - Abnormal; Notable for the following components:   Acetaminophen (Tylenol), Serum <10 (*)    All other components within normal limits  RAPID URINE DRUG SCREEN, HOSP PERFORMED - Abnormal; Notable for the following components:   Benzodiazepines POSITIVE (*)    All other components within normal limits  RESP PANEL BY RT-PCR (FLU A&B, COVID) ARPGX2  COMPREHENSIVE METABOLIC PANEL  ETHANOL  CBC  POC URINE PREG, ED    EKG None  Radiology No results found.  Procedures Procedures (including critical care time)  Medications Ordered in ED Medications  influenza vac split quadrivalent PF (FLUARIX) injection 0.5 mL (has no administration in time range)    ED Course  I have reviewed the triage vital signs and the nursing notes.  Pertinent labs & imaging results that were available during my care of the patient were reviewed by me and considered in my medical decision making (see chart for details).    MDM  Rules/Calculators/A&P                           Patient Vitals for the past 24 hrs:  BP Temp Temp src Pulse Resp SpO2  01/16/20 2023 (!) 144/83 98.6 F (37 C) Oral 98 16 100 %      Medical Decision Making:  This patient is presenting for evaluation of suicidal ideation, which does require a range of treatment options, and is a complaint that involves a moderate risk of morbidity and mortality. The differential diagnoses include suicidal ideation, unstable psychiatric condition, acute illness. I decided to review old records, and in summary [place] female, with onset of suicidal thoughts today, without plan.  I did not require additional historical information from anyone.  Clinical Laboratory Tests Ordered, included acetaminophen level, salicylate level CBC, Metabolic panel, Pregnancy test and Urine drug screen, alcohol level, Covid test, flu test. Review indicates normal findings except presence of benzodiazepine on UDS, which is a prescribed medication.        Critical Interventions-clinical evaluation, laboratory testing, observation, TTS consultation   CRITICAL CARE- No Performed by: Daleen Bo  Nursing Notes Reviewed/ Care Coordinated Applicable Imaging Reviewed Interpretation of Laboratory Data incorporated into ED treatment   Plan: TTS consult. Dispo after that.    Final Clinical Impression(s) / ED Diagnoses Final diagnoses:  Suicidal ideation    Rx / DC Orders ED Discharge Orders    None       Daleen Bo, MD 01/16/20 2348

## 2020-01-16 NOTE — ED Notes (Signed)
Pt cooperative but very anxious

## 2020-01-16 NOTE — ED Notes (Signed)
One bag of belongings placed in top shelf of locker room

## 2020-01-17 NOTE — Discharge Instructions (Addendum)
Please avail yourself of the resources provided to you by TTS.
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
## 2020-01-17 NOTE — ED Notes (Signed)
Spoke with Kristeen Miss from behavioral health, provided collateral information as requested, frined Kendall Toler [phone]

## 2020-01-17 NOTE — ED Notes (Signed)
Pt tearful in room, reports she has a hx of generalized anxiety disorder, depression, and bipolar disorder. Reports she has "thought about" suicide but has no intent or plan,denies HI, denies AVH, denies ETOH abuse or use of any illicit substances. Updated on POC. Changed into facility attire. Sitter at bedside. NAD noted.

## 2020-01-17 NOTE — ED Provider Notes (Signed)
TTS consultation is appreciated.  Patient is felt to be safe for discharge, will follow up with her own therapist.  TTS has also provided additional resources for the patient.   Delora Fuel, MD 69/67/89 Reece Agar

## 2020-06-20 ENCOUNTER — Other Ambulatory Visit: Payer: Self-pay

## 2020-06-20 ENCOUNTER — Emergency Department (HOSPITAL_COMMUNITY): Payer: Self-pay

## 2020-06-20 ENCOUNTER — Emergency Department (HOSPITAL_COMMUNITY)
Admission: EM | Admit: 2020-06-20 | Discharge: 2020-06-20 | Disposition: A | Discharge status: Home / Self Care | Payer: Self-pay | Attending: Emergency Medicine | Admitting: Emergency Medicine

## 2020-06-20 ENCOUNTER — Encounter (HOSPITAL_COMMUNITY): Payer: Self-pay | Admitting: *Deleted

## 2020-06-20 ENCOUNTER — Ambulatory Visit
Admission: EM | Admit: 2020-06-20 | Discharge: 2020-06-20 | Disposition: A | Discharge status: Home / Self Care | Payer: Medicaid Other

## 2020-06-20 DIAGNOSIS — R197 Diarrhea, unspecified: Secondary | ICD-10-CM | POA: Insufficient documentation

## 2020-06-20 DIAGNOSIS — K219 Gastro-esophageal reflux disease without esophagitis: Secondary | ICD-10-CM | POA: Insufficient documentation

## 2020-06-20 DIAGNOSIS — F17200 Nicotine dependence, unspecified, uncomplicated: Secondary | ICD-10-CM | POA: Insufficient documentation

## 2020-06-20 DIAGNOSIS — R110 Nausea: Secondary | ICD-10-CM | POA: Insufficient documentation

## 2020-06-20 DIAGNOSIS — R1010 Upper abdominal pain, unspecified: Secondary | ICD-10-CM | POA: Insufficient documentation

## 2020-06-20 LAB — CBC WITH DIFFERENTIAL/PLATELET
Abs Immature Granulocytes: 0.03 10*3/uL (ref 0.00–0.07)
Basophils Absolute: 0 10*3/uL (ref 0.0–0.1)
Basophils Relative: 0 %
Eosinophils Absolute: 0.1 10*3/uL (ref 0.0–0.5)
Eosinophils Relative: 1 %
HCT: 41.4 % (ref 36.0–46.0)
Hemoglobin: 14 g/dL (ref 12.0–15.0)
Immature Granulocytes: 0 %
Lymphocytes Relative: 13 %
Lymphs Abs: 1.6 10*3/uL (ref 0.7–4.0)
MCH: 28.3 pg (ref 26.0–34.0)
MCHC: 33.8 g/dL (ref 30.0–36.0)
MCV: 83.6 fL (ref 80.0–100.0)
Monocytes Absolute: 0.8 10*3/uL (ref 0.1–1.0)
Monocytes Relative: 6 %
Neutro Abs: 10.1 10*3/uL — ABNORMAL HIGH (ref 1.7–7.7)
Neutrophils Relative %: 80 %
Platelets: 230 10*3/uL (ref 150–400)
RBC: 4.95 MIL/uL (ref 3.87–5.11)
RDW: 13 % (ref 11.5–15.5)
WBC: 12.6 10*3/uL — ABNORMAL HIGH (ref 4.0–10.5)
nRBC: 0 % (ref 0.0–0.2)

## 2020-06-20 LAB — COMPREHENSIVE METABOLIC PANEL
ALT: 17 U/L (ref 0–44)
AST: 19 U/L (ref 15–41)
Albumin: 4.5 g/dL (ref 3.5–5.0)
Alkaline Phosphatase: 44 U/L (ref 38–126)
Anion gap: 6 (ref 5–15)
BUN: 8 mg/dL (ref 6–20)
CO2: 24 mmol/L (ref 22–32)
Calcium: 9.1 mg/dL (ref 8.9–10.3)
Chloride: 106 mmol/L (ref 98–111)
Creatinine, Ser: 0.67 mg/dL (ref 0.44–1.00)
GFR, Estimated: >60 mL/min (ref >60)
Glucose, Bld: 88 mg/dL (ref 70–99)
Potassium: 3.7 mmol/L (ref 3.5–5.1)
Sodium: 136 mmol/L (ref 135–145)
Total Bilirubin: 0.5 mg/dL (ref 0.3–1.2)
Total Protein: 8 g/dL (ref 6.5–8.1)

## 2020-06-20 LAB — URINALYSIS, ROUTINE W REFLEX MICROSCOPIC
Bilirubin Urine: NEGATIVE
Glucose, UA: NEGATIVE mg/dL
Hgb urine dipstick: NEGATIVE
Ketones, ur: NEGATIVE mg/dL
Leukocytes,Ua: NEGATIVE
Nitrite: NEGATIVE
Protein, ur: NEGATIVE mg/dL
Specific Gravity, Urine: 1.006 (ref 1.005–1.030)
pH: 6 (ref 5.0–8.0)

## 2020-06-20 LAB — LIPASE, BLOOD: Lipase: 33 U/L (ref 11–51)

## 2020-06-20 LAB — POC URINE PREG, ED: Preg Test, Ur: NEGATIVE

## 2020-06-20 MED ORDER — METRONIDAZOLE 500 MG PO TABS: 500.0000 mg | ORAL_TABLET | Freq: Two times a day (BID) | ORAL | 0 refills | Status: DC

## 2020-06-20 NOTE — ED Provider Notes (Signed)
[facility] Provider Note   CSN: [id number] Arrival date & time: 06/20/20  1102     History Chief Complaint  Patient presents with  . Abdominal Pain    Cynthia Ross is a 20 y.o. female with a history of GERD, IBS, hypertension and PCOS presenting for evaluation of upper abdominal pain which she describes as intermittent, sharp along with nausea and diarrhea.  She reports a history of mixed IBS and states that she was very constipated last week and now this week she has had diarrhea.  She did see green  stools last weekend and 1 or 2 episodes of blood-tinged to the stool, this week her stools have been brown and watery, reporting several episodes of diarrhea daily.  She also woke this morning with abdominal distention stating her abdomen felt very hard but it has significantly improved after taking Gas-X, famotidine and 4 Imodium tablets.  She has been passing flatus and states this probably also helped with the pain and distention.  She denies fevers or chills, dysuria, vaginal discharge or other complaints.  Her LMP was May 3 and normal, did a home pregnancy test yesterday which was negative.  No fevers or chills or other complaints.  She adds additionally she has a new puppy that was diagnosed last week with giardia, the puppy has been licking her face and she is concerned about this infection being the source of her symptoms.  Also endorses swimming in a lake last week.   HPI     Past Medical History:  Diagnosis Date  . Anxiety   . Bipolar depression ([facility])   . Borderline personality disorder ([facility])   . Depression   . GERD (gastroesophageal reflux disease)   . Hypertension   . IBS (irritable bowel syndrome)     Patient Active Problem List   Diagnosis Date Noted  . Abdominal pain of multiple sites 03/11/2017  . Irritable bowel syndrome with both constipation and diarrhea 12/14/2016  . Dyspareunia in female 12/14/2016  . Dysmenorrhea 12/14/2016  . Yeast  infection of the vagina 12/14/2016  . Vaginal discharge 12/14/2016  . Depression 09/22/2012  . Generalized anxiety disorder 09/22/2012  . Family history of bipolar disorder 09/22/2012  . Family history of depression 09/22/2012  . Lice [id number]/6281  . Dyspepsia 09/08/2012  . GERD (gastroesophageal reflux disease) 09/08/2012    Past Surgical History:  Procedure Laterality Date  . None       OB History    Gravida  0   Para  0   Term  0   Preterm  0   AB  0   Living  0     SAB  0   IAB  0   Ectopic  0   Multiple  0   Live Births  0           Family History  Problem Relation Age of Onset  . Depression Father   . Irritable bowel syndrome Father   . Mental illness Mother   . Cancer Mother        ovary  . Endometriosis Mother   . COPD Maternal Grandmother   . Cancer Maternal Grandmother        ovary  . Colon cancer Neg Hx     Social History   Tobacco Use  . Smoking status: Current Every Day Smoker    Packs/day: 0.00  . Smokeless tobacco: Never Used  Vaping Use  . Vaping Use: Never used  Substance Use Topics  . Alcohol use: Yes    Comment: 4 days a week  . Drug use: Yes    Frequency: 1.0 times per week    Types: Marijuana    Home Medications Prior to Admission medications   Medication Sig Start Date End Date Taking? Authorizing Provider  metroNIDAZOLE (FLAGYL) 500 MG tablet Take 1 tablet (500 mg total) by mouth 2 (two) times daily. 06/20/20  Yes Guilianna Mckoy, Almyra Free, PA-C  benzonatate (TESSALON) 100 MG capsule Take 1 capsule (100 mg total) by mouth 2 (two) times daily as needed for cough. 09/15/18   Hedges, Dellis Filbert, PA-C  ciprofloxacin-dexamethasone (CIPRODEX) OTIC suspension Place 4 drops into the right ear 2 (two) times daily. 02/20/18   Sharion Balloon, FNP  DIPHENHYDRAMINE CITRATE PO Take by mouth.    [provider]  fluconazole (DIFLUCAN) 200 MG tablet Take one dose by mouth, wait 72 hours, and then take second dose by mouth 08/17/18   Wurst,  [place], PA-C  hydrOXYzine (ATARAX/VISTARIL) 25 MG tablet Take 25 mg by mouth at bedtime.     [provider]  ibuprofen (ADVIL,MOTRIN) 600 MG tablet Take 1 tablet (600 mg total) by mouth 4 (four) times daily. 10/05/17   Lily Kocher, PA-C  loperamide (IMODIUM A-D) 2 MG tablet Take 2 mg by mouth 4 (four) times daily as needed for diarrhea or loose stools.    [provider]  LORazepam (ATIVAN) 1 MG tablet Take 1 mg by mouth 2 (two) times daily.    [provider]  norgestimate-ethinyl estradiol ([place] 28) 0.25-35 MG-MCG tablet Take 1 tablet by mouth daily. 12/16/17   Estill Dooms, NP  ondansetron (ZOFRAN) 4 MG tablet Take 4 mg by mouth 3 (three) times daily as needed. 11/09/19   [provider]  pantoprazole (PROTONIX) 40 MG tablet Take 40 mg by mouth at bedtime.    [provider]  Simethicone (GAS-X PO) Take 1 tablet by mouth daily.    [provider]  hyoscyamine (LEVSIN, ANASPAZ) 0.125 MG tablet Take 1 tablet (0.125 mg total) by mouth every 6 (six) hours as needed. Patient not taking: Reported on 12/16/2017 12/14/16 08/17/18  Estill Dooms, NP    Allergies    Other  Review of Systems   Review of Systems  Constitutional: Negative for chills and fever.  HENT: Negative for congestion and sore throat.   Eyes: Negative.   Respiratory: Negative for chest tightness and shortness of breath.   Cardiovascular: Negative for chest pain.  Gastrointestinal: Positive for abdominal pain, constipation, diarrhea and nausea. Negative for vomiting.  Genitourinary: Negative.  Negative for dysuria and vaginal discharge.  Musculoskeletal: Negative for arthralgias, joint swelling and neck pain.  Skin: Negative.  Negative for rash and wound.  Neurological: Negative for dizziness, weakness, light-headedness, numbness and headaches.  Psychiatric/Behavioral: Negative.   All other systems reviewed and are negative.   Physical Exam Updated  Vital Signs BP 120/81 (BP Location: Left Arm)   Pulse 83   Temp 98.5 F (36.9 C) (Oral)   Resp 18   Ht 5\' 3"  (1.6 m)   Wt 83.6 kg   LMP 05/27/2020   SpO2 100%   BMI 32.66 kg/m   Physical Exam Vitals and nursing note reviewed.  Constitutional:      Appearance: She is well-developed.  HENT:     Head: Normocephalic and atraumatic.  Eyes:     Conjunctiva/sclera: Conjunctivae normal.  Cardiovascular:     Rate and Rhythm: Normal rate  and regular rhythm.     Heart sounds: Normal heart sounds.  Pulmonary:     Effort: Pulmonary effort is normal.     Breath sounds: Normal breath sounds. No wheezing.  Abdominal:     General: Bowel sounds are normal.     Palpations: Abdomen is soft.     Tenderness: There is no abdominal tenderness. There is no right CVA tenderness, left CVA tenderness, guarding or rebound. Negative signs include Murphy's sign.  Musculoskeletal:        General: Normal range of motion.     Cervical back: Normal range of motion.  Skin:    General: Skin is warm and dry.  Neurological:     Mental Status: She is alert.     ED Results / Procedures / Treatments   Labs (all labs ordered are listed, but only abnormal results are displayed) Labs Reviewed  CBC WITH DIFFERENTIAL/PLATELET - Abnormal; Notable for the following components:      Result Value   WBC 12.6 (*)    Neutro Abs 10.1 (*)    All other components within normal limits  URINALYSIS, ROUTINE W REFLEX MICROSCOPIC - Abnormal; Notable for the following components:   Color, Urine STRAW (*)    APPearance HAZY (*)    All other components within normal limits  COMPREHENSIVE METABOLIC PANEL  LIPASE, BLOOD  POC URINE PREG, ED    EKG None  Radiology DG ABD ACUTE 2+V W 1V CHEST  Result Date: 06/20/2020 CLINICAL DATA:  Upper abdominal pain. Constipation diarrhea. Question obstruction. EXAM: DG ABDOMEN ACUTE WITH 1 VIEW CHEST COMPARISON:  CT abdomen pelvis 01/21/2017 FINDINGS: There is no evidence of dilated  bowel loops or free intraperitoneal air. No radiopaque calculi or other significant radiographic abnormality is seen. Heart size and mediastinal contours are within normal limits. Both lungs are clear. IMPRESSION: Negative abdominal radiographs.  No acute cardiopulmonary disease. Electronically Signed   By: San Morelle M.D.   On: 06/20/2020 14:14    Procedures Procedures   Medications Ordered in ED Medications - No data to display  ED Course  I have reviewed the triage vital signs and the nursing notes.  Pertinent labs & imaging results that were available during my care of the patient were reviewed by me and considered in my medical decision making (see chart for details).    MDM Rules/Calculators/A&P                          Patient with a history of mixed IBS, acute on chronic intermittent abdominal pain, currently having diarrhea with potential exposure to Giardia per above.  Her abdominal exam is benign both initially on presentation and it reexam prior to discharge.  No guarding no rebound, abdomen is soft.  Labs and imaging reviewed and discussed with patient.  She does have a mild elevation in her WBC count but again her abdomen is benign, no indication for CT imaging at this time.  Given her description of a "hard abdomen which has now resolved, acute abdominal series was obtained, there is no evidence for obstruction or constipation or obstipation.  Given her potential significant exposure to Giardia she has been placed on metronidazole.  We discussed obtaining a stool sample so we can run stool cultures, however patient took 4 Imodium tablets prior to arrival and has been unable to have a bowel movement since.  She was given strict return precautions.  The patient appears reasonably screened and/or stabilized  for discharge and I doubt any other medical condition or other [facility] requiring further screening, evaluation, or treatment in the ED at this time prior to  discharge.  Final Clinical Impression(s) / ED Diagnoses Final diagnoses:  Diarrhea of presumed infectious origin    Rx / DC Orders ED Discharge Orders         Ordered    metroNIDAZOLE (FLAGYL) 500 MG tablet  2 times daily        06/20/20 1705           Evalee Jefferson, Hershal Coria 06/20/20 1847    Daleen Bo, MD 06/21/20 1157

## 2020-06-20 NOTE — ED Notes (Signed)
Yesterday pt says she was constipated.  This am she had 4-5 soft loose stools, brown.  Denies taking laxative.  Currently rate epigastric pain 2/10.

## 2020-06-20 NOTE — ED Triage Notes (Signed)
Epigastric pain, nausea and diarrhea that started this morning.  Has taken zofran, pepcid and gas x that provided minimal relief.  Pt reports constipation last week and had some blood in stool when she had bowel movement.

## 2020-06-20 NOTE — ED Triage Notes (Signed)
Referred from urgent care for abdominal pain, history of IBS

## 2020-06-20 NOTE — ED Notes (Signed)
Patient is being discharged from the Urgent Care and sent to the Emergency Department via pov . Per [place], [place], patient is in need of higher level of care due to abd pain. Patient is aware and verbalizes understanding of plan of care.  Vitals:   06/20/20 1050  BP: 119/72  Pulse: 98  Resp: 19  Temp: 98 F (36.7 C)  SpO2: 98%

## 2020-06-20 NOTE — Discharge Instructions (Addendum)
You are being treated for giardia given your recent exposure and symptoms.  Take the entire course of the antibiotic prescribed.  Plan a recheck if your symptoms are not improving with this treatment.

## 2020-06-20 NOTE — ED Notes (Signed)
Pt is asking to go to her car.  Informed that she can not go to her car with IV in place.

## 2020-07-26 ENCOUNTER — Emergency Department (HOSPITAL_COMMUNITY): Payer: Medicaid Other

## 2020-07-26 ENCOUNTER — Emergency Department (HOSPITAL_COMMUNITY)
Admission: EM | Admit: 2020-07-26 | Discharge: 2020-07-26 | Disposition: A | Discharge status: Home / Self Care | Payer: Medicaid Other | Attending: Emergency Medicine | Admitting: Emergency Medicine

## 2020-07-26 ENCOUNTER — Encounter (HOSPITAL_COMMUNITY): Payer: Self-pay | Admitting: *Deleted

## 2020-07-26 ENCOUNTER — Other Ambulatory Visit: Payer: Self-pay

## 2020-07-26 DIAGNOSIS — R1013 Epigastric pain: Secondary | ICD-10-CM | POA: Insufficient documentation

## 2020-07-26 DIAGNOSIS — I10 Essential (primary) hypertension: Secondary | ICD-10-CM | POA: Insufficient documentation

## 2020-07-26 DIAGNOSIS — R112 Nausea with vomiting, unspecified: Secondary | ICD-10-CM

## 2020-07-26 DIAGNOSIS — Z87891 Personal history of nicotine dependence: Secondary | ICD-10-CM | POA: Insufficient documentation

## 2020-07-26 DIAGNOSIS — R109 Unspecified abdominal pain: Secondary | ICD-10-CM

## 2020-07-26 LAB — URINALYSIS, ROUTINE W REFLEX MICROSCOPIC
Bilirubin Urine: NEGATIVE
Glucose, UA: NEGATIVE mg/dL
Ketones, ur: 5 mg/dL — AB
Nitrite: NEGATIVE
Protein, ur: NEGATIVE mg/dL
Specific Gravity, Urine: 1.026 (ref 1.005–1.030)
pH: 5 (ref 5.0–8.0)

## 2020-07-26 LAB — CBC WITH DIFFERENTIAL/PLATELET
Abs Immature Granulocytes: 0.06 10*3/uL (ref 0.00–0.07)
Basophils Absolute: 0 10*3/uL (ref 0.0–0.1)
Basophils Relative: 0 %
Eosinophils Absolute: 0.1 10*3/uL (ref 0.0–0.5)
Eosinophils Relative: 0 %
HCT: 41.8 % (ref 36.0–46.0)
Hemoglobin: 14.2 g/dL (ref 12.0–15.0)
Immature Granulocytes: 0 %
Lymphocytes Relative: 6 %
Lymphs Abs: 1 10*3/uL (ref 0.7–4.0)
MCH: 28.1 pg (ref 26.0–34.0)
MCHC: 34 g/dL (ref 30.0–36.0)
MCV: 82.8 fL (ref 80.0–100.0)
Monocytes Absolute: 1 10*3/uL (ref 0.1–1.0)
Monocytes Relative: 6 %
Neutro Abs: 14.1 10*3/uL — ABNORMAL HIGH (ref 1.7–7.7)
Neutrophils Relative %: 88 %
Platelets: 248 10*3/uL (ref 150–400)
RBC: 5.05 MIL/uL (ref 3.87–5.11)
RDW: 12.7 % (ref 11.5–15.5)
WBC: 16.3 10*3/uL — ABNORMAL HIGH (ref 4.0–10.5)
nRBC: 0 % (ref 0.0–0.2)

## 2020-07-26 LAB — COMPREHENSIVE METABOLIC PANEL
ALT: 18 U/L (ref 0–44)
AST: 21 U/L (ref 15–41)
Albumin: 4.4 g/dL (ref 3.5–5.0)
Alkaline Phosphatase: 46 U/L (ref 38–126)
Anion gap: 9 (ref 5–15)
BUN: 8 mg/dL (ref 6–20)
CO2: 21 mmol/L — ABNORMAL LOW (ref 22–32)
Calcium: 9.3 mg/dL (ref 8.9–10.3)
Chloride: 108 mmol/L (ref 98–111)
Creatinine, Ser: 0.57 mg/dL (ref 0.44–1.00)
GFR, Estimated: >60 mL/min (ref >60)
Glucose, Bld: 108 mg/dL — ABNORMAL HIGH (ref 70–99)
Potassium: 3.7 mmol/L (ref 3.5–5.1)
Sodium: 138 mmol/L (ref 135–145)
Total Bilirubin: 0.6 mg/dL (ref 0.3–1.2)
Total Protein: 7.8 g/dL (ref 6.5–8.1)

## 2020-07-26 LAB — LIPASE, BLOOD: Lipase: 31 U/L (ref 11–51)

## 2020-07-26 LAB — I-STAT BETA HCG BLOOD, ED (MC, WL, AP ONLY): I-stat hCG, quantitative: <5 m[IU]/mL (ref <5)

## 2020-07-26 MED ORDER — ALUM & MAG HYDROXIDE-SIMETH 200-200-20 MG/5ML PO SUSP
30.0000 mL | Freq: Once | ORAL | Status: AC
  Administered 2020-07-26: 30 mL via ORAL
  Filled 2020-07-26: qty 30

## 2020-07-26 MED ORDER — SODIUM CHLORIDE 0.9 % IV SOLN
12.5000 mg | Freq: Once | INTRAVENOUS | Status: AC
  Administered 2020-07-26: 12.5 mg via INTRAVENOUS
  Filled 2020-07-26: qty 0.5

## 2020-07-26 MED ORDER — CEPHALEXIN 500 MG PO CAPS
500.0000 mg | ORAL_CAPSULE | Freq: Once | ORAL | Status: AC
  Administered 2020-07-26: 500 mg via ORAL
  Filled 2020-07-26: qty 1

## 2020-07-26 MED ORDER — IOHEXOL 300 MG/ML  SOLN
100.0000 mL | Freq: Once | INTRAMUSCULAR | Status: AC | PRN
  Administered 2020-07-26: 100 mL via INTRAVENOUS

## 2020-07-26 MED ORDER — FLUCONAZOLE 200 MG PO TABS: 200.0000 mg | ORAL_TABLET | Freq: Once | ORAL | 0 refills | Status: AC

## 2020-07-26 MED ORDER — PROMETHAZINE HCL 25 MG PO TABS: 25.0000 mg | ORAL_TABLET | Freq: Four times a day (QID) | ORAL | 0 refills | Status: DC | PRN

## 2020-07-26 MED ORDER — CEPHALEXIN 500 MG PO CAPS: 500.0000 mg | ORAL_CAPSULE | Freq: Two times a day (BID) | ORAL | 0 refills | Status: AC

## 2020-07-26 MED ORDER — LIDOCAINE VISCOUS HCL 2 % MT SOLN
15.0000 mL | Freq: Once | OROMUCOSAL | Status: AC
  Administered 2020-07-26: 15 mL via ORAL
  Filled 2020-07-26: qty 15

## 2020-07-26 NOTE — ED Triage Notes (Signed)
Pt c/o mid to upper abdominal pain and vomiting that started at 0400 this morning. Feels as if she's going to have diarrhea, but hasn't. Took Zofran 4mg  PTA at 0445 with some relief.

## 2020-07-26 NOTE — Discharge Instructions (Addendum)
I prescribed you Phenergan which can help with nausea.  Please do not take it at the same time as Zofran.  You should use 1 medication or the other every 8 hours.

## 2020-07-26 NOTE — ED Provider Notes (Signed)
[facility] EMERGENCY DEPARTMENT Provider Note   CSN: [id number] Arrival date & time: 07/26/20  7121     History Chief Complaint  Patient presents with   Abdominal Pain    [place] is a 20 y.o. female w/ hx of reflux, IBS, PCOS, bipolar disorder, presenting to ED with abdominal pain.  Reports onset around 0400 this morning, woke her up from sleep, cramping mid and upper abdominal pain that does not radiate.  It is better when hunching forward.  Tried zofran at home, some relief.   She reports that she did have several episodes of vomiting and dry heaving, and now has sharp epigastric pain and soreness in her throat from the vomiting.  She reports she has not had any diarrhea.  She did have a bowel movement yesterday.  When asked if she may be constipated she says "maybe".  She denies any dysuria, hematuria, vaginal bleeding or discharge.  She reports she is not on her menstrual cycle.  She denies any lower pelvic pain.  She denies any new possible STI exposure.    She reports to me that this pain originally began near the end of May, when she was seen in the ED.  She advised at that time, and per PA provider's note, that she may be having an IBS issue.  She was also placed on metronidazole x 7 days due to possible exposure to Giardia (she had a new puppy at home who was diagnosed with Giardia and had been licking her face).  She tells me that she only completed about 3 to 4 days of this prescription because it was "too hard on my stomach".  She reports that since that visit to the ED, she has about a once weekly episode of sharp pain in this similar presentation that can last a few minutes and then go away.  However the pain was never as intense as this morning, when she was "doubled over."  CT from 2018 showing 3.7 cm right adnexal cyst, simple.  Denies hx of abdominal surgery  HPI     Past Medical History:  Diagnosis Date   Anxiety    Bipolar depression ([place])    Borderline  personality disorder ([place])    Depression    GERD (gastroesophageal reflux disease)    Hypertension    IBS (irritable bowel syndrome)     Patient Active Problem List   Diagnosis Date Noted   Abdominal pain of multiple sites 03/11/2017   Irritable bowel syndrome with both constipation and diarrhea 12/14/2016   Dyspareunia in female 12/14/2016   Dysmenorrhea 12/14/2016   Yeast infection of the vagina 12/14/2016   Vaginal discharge 12/14/2016   Depression 09/22/2012   Generalized anxiety disorder 09/22/2012   Family history of bipolar disorder 09/22/2012   Family history of depression 97/58/8325   Lice 49/82/6415   Dyspepsia 09/08/2012   GERD (gastroesophageal reflux disease) 09/08/2012    Past Surgical History:  Procedure Laterality Date   None       OB History     Gravida  0   Para  0   Term  0   Preterm  0   AB  0   Living  0      SAB  0   IAB  0   Ectopic  0   Multiple  0   Live Births  0           Family History  Problem Relation Age of Onset  Depression Father    Irritable bowel syndrome Father    Mental illness Mother    Cancer Mother        ovary   Endometriosis Mother    COPD Maternal Grandmother    Cancer Maternal Grandmother        ovary   Colon cancer Neg Hx     Social History   Tobacco Use   Smoking status: Former    Packs/day: 0.00    Pack years: 0.00    Types: Cigarettes   Smokeless tobacco: Never  Vaping Use   Vaping Use: Never used  Substance Use Topics   Alcohol use: Yes    Comment: 2 days a week   Drug use: Not Currently    Frequency: 1.0 times per week    Types: Marijuana    Home Medications Prior to Admission medications   Medication Sig Start Date End Date Taking? Authorizing Provider  cephALEXin (KEFLEX) 500 MG capsule Take 1 capsule (500 mg total) by mouth 2 (two) times daily for 5 days. 07/26/20 07/31/20 Yes Ishi Danser, Carola Rhine, MD  fluconazole (DIFLUCAN) 200 MG tablet Take 1 tablet (200 mg total) by mouth  once for 1 dose. 07/26/20 07/26/20 Yes Marine Lezotte, Carola Rhine, MD  promethazine (PHENERGAN) 25 MG tablet Take 1 tablet (25 mg total) by mouth every 6 (six) hours as needed for up to 10 doses for nausea or vomiting. 07/26/20  Yes Antia Rahal, Carola Rhine, MD  benzonatate (TESSALON) 100 MG capsule Take 1 capsule (100 mg total) by mouth 2 (two) times daily as needed for cough. 09/15/18   Hedges, Dellis Filbert, PA-C  ciprofloxacin-dexamethasone (CIPRODEX) OTIC suspension Place 4 drops into the right ear 2 (two) times daily. 02/20/18   Sharion Balloon, FNP  DIPHENHYDRAMINE CITRATE PO Take by mouth.    [provider]  fluconazole (DIFLUCAN) 200 MG tablet Take one dose by mouth, wait 72 hours, and then take second dose by mouth 08/17/18   Wurst, [place], PA-C  hydrOXYzine (ATARAX/VISTARIL) 25 MG tablet Take 25 mg by mouth at bedtime.     [provider]  ibuprofen (ADVIL,MOTRIN) 600 MG tablet Take 1 tablet (600 mg total) by mouth 4 (four) times daily. 10/05/17   Lily Kocher, PA-C  loperamide (IMODIUM A-D) 2 MG tablet Take 2 mg by mouth 4 (four) times daily as needed for diarrhea or loose stools.    [provider]  LORazepam (ATIVAN) 1 MG tablet Take 1 mg by mouth 2 (two) times daily.    [provider]  metroNIDAZOLE (FLAGYL) 500 MG tablet Take 1 tablet (500 mg total) by mouth 2 (two) times daily. 06/20/20   Evalee Jefferson, PA-C  norgestimate-ethinyl estradiol (SPRINTEC 28) 0.25-35 MG-MCG tablet Take 1 tablet by mouth daily. 12/16/17   Estill Dooms, NP  ondansetron (ZOFRAN) 4 MG tablet Take 4 mg by mouth 3 (three) times daily as needed. 11/09/19   [provider]  pantoprazole (PROTONIX) 40 MG tablet Take 40 mg by mouth at bedtime.    [provider]  Simethicone (GAS-X PO) Take 1 tablet by mouth daily.    [provider]  hyoscyamine (LEVSIN, ANASPAZ) 0.125 MG tablet Take 1 tablet (0.125 mg total) by mouth every 6 (six) hours as needed. Patient not taking:  Reported on 12/16/2017 12/14/16 08/17/18  Estill Dooms, NP    Allergies    Other  Review of Systems   Review of Systems  Constitutional:  Negative for chills and fever.  Respiratory:  Negative for cough and shortness of breath.   Cardiovascular:  Negative for chest pain and palpitations.  Gastrointestinal:  Positive for abdominal pain, nausea and vomiting. Negative for diarrhea.  Genitourinary:  Negative for difficulty urinating, dysuria, hematuria, pelvic pain, vaginal bleeding and vaginal discharge.  Musculoskeletal:  Negative for arthralgias and back pain.  Skin:  Negative for color change and rash.  Neurological:  Negative for syncope and headaches.  All other systems reviewed and are negative.  Physical Exam Updated Vital Signs BP 120/64 (BP Location: Right Arm)   Pulse 83   Temp (!) 97.5 F (36.4 C) (Oral)   Resp 17   Ht 5\' 3"  (1.6 m)   Wt 81.6 kg   LMP 06/30/2020   SpO2 100%   BMI 31.89 kg/m   Physical Exam Constitutional:      General: She is not in acute distress. HENT:     Head: Normocephalic and atraumatic.  Eyes:     Conjunctiva/sclera: Conjunctivae normal.     Pupils: Pupils are equal, round, and reactive to light.  Cardiovascular:     Rate and Rhythm: Normal rate and regular rhythm.  Pulmonary:     Effort: Pulmonary effort is normal. No respiratory distress.  Abdominal:     General: There is no distension.     Tenderness: There is no abdominal tenderness. There is no right CVA tenderness, left CVA tenderness, guarding or rebound. Negative signs include Murphy's sign and McBurney's sign.  Skin:    General: Skin is warm and dry.  Neurological:     General: No focal deficit present.     Mental Status: She is alert. Mental status is at baseline.  Psychiatric:        Mood and Affect: Mood normal.        Behavior: Behavior normal.    ED Results / Procedures / Treatments   Labs (all labs ordered are listed, but only abnormal results are  displayed) Labs Reviewed  COMPREHENSIVE METABOLIC PANEL - Abnormal; Notable for the following components:      Result Value   CO2 21 (*)    Glucose, Bld 108 (*)    All other components within normal limits  CBC WITH DIFFERENTIAL/PLATELET - Abnormal; Notable for the following components:   WBC 16.3 (*)    Neutro Abs 14.1 (*)    All other components within normal limits  URINALYSIS, ROUTINE W REFLEX MICROSCOPIC - Abnormal; Notable for the following components:   APPearance HAZY (*)    Hgb urine dipstick SMALL (*)    Ketones, ur 5 (*)    Leukocytes,Ua SMALL (*)    Bacteria, UA RARE (*)    All other components within normal limits  OVA + PARASITE EXAM  LIPASE, BLOOD  I-STAT BETA HCG BLOOD, ED ([facility], [facility], [facility] ONLY)    EKG None  Radiology CT ABDOMEN PELVIS W CONTRAST  Result Date: 07/26/2020 CLINICAL DATA:  Acute epigastric abdominal pain. EXAM: CT ABDOMEN AND PELVIS WITH CONTRAST TECHNIQUE: Multidetector CT imaging of the abdomen and pelvis was performed using the standard protocol following bolus administration of intravenous contrast. CONTRAST:  141mL OMNIPAQUE IOHEXOL 300 MG/ML  SOLN COMPARISON:  January 18, 2017. FINDINGS: Lower chest: No acute abnormality. Hepatobiliary: No focal liver abnormality is seen. No gallstones, gallbladder wall thickening, or biliary dilatation. Pancreas: Unremarkable. No pancreatic ductal dilatation or surrounding inflammatory changes. Spleen: Normal in size without focal abnormality. Adrenals/Urinary Tract: Adrenal glands appear normal. Left renal cyst is noted. No hydronephrosis or renal obstruction  is noted. No renal or ureteral calculi are noted. Urinary bladder is unremarkable. Stomach/Bowel: Stomach is within normal limits. Appendix appears normal. No evidence of bowel wall thickening, distention, or inflammatory changes. Vascular/Lymphatic: No significant vascular findings are present. No enlarged abdominal or pelvic lymph nodes. Reproductive: Uterus and  bilateral adnexa are unremarkable. Other: No abdominal wall hernia or abnormality. No abdominopelvic ascites. Musculoskeletal: No acute or significant osseous findings. IMPRESSION: No abnormality seen in the abdomen or pelvis. Electronically Signed   By: Marijo Conception M.D.   On: 07/26/2020 09:20   US PELVIC COMPLETE W TRANSVAGINAL AND TORSION R/O  Result Date: 07/26/2020 CLINICAL DATA:  Abdominal pain.  History of PCOS. EXAM: TRANSABDOMINAL AND TRANSVAGINAL ULTRASOUND OF PELVIS DOPPLER ULTRASOUND OF OVARIES TECHNIQUE: Both transabdominal and transvaginal ultrasound examinations of the pelvis were performed. Transabdominal technique was performed for global imaging of the pelvis including uterus, ovaries, adnexal regions, and pelvic cul-de-sac. It was necessary to proceed with endovaginal exam following the transabdominal exam to visualize the ovaries and endometrium. Color and duplex Doppler ultrasound was utilized to evaluate blood flow to the ovaries. COMPARISON:  CT of the abdomen and pelvis on 07/26/2020, pelvic ultrasound 12/14/2016 FINDINGS: Uterus Measurements: 6.8 x 4.7 x 5.2 centimeters = volume: 87.2 mL. Uterus is anteflexed, without mass. Endometrium Thickness: 8.1 millimeters.  No focal abnormality visualized. Right ovary Measurements: 3.0 x 2.2 x 1.9 centimeters = volume: 6.4 mL. Normal appearance/no adnexal mass. Left ovary Measurements: 2.9 x 1.8 x 3.0 centimeters = volume: 8.4 mL. Normal appearance/no adnexal mass. Pulsed Doppler evaluation of both ovaries demonstrates normal low-resistance arterial and venous waveforms. Other findings No abnormal free fluid. IMPRESSION: Normal pelvic ultrasound.  No mass or torsion. Electronically Signed   By: Nolon Nations M.D.   On: 07/26/2020 11:11    Procedures Procedures   Medications Ordered in ED Medications  promethazine (PHENERGAN) 12.5 mg in sodium chloride 0.9 % 50 mL IVPB (0 mg Intravenous Stopped 07/26/20 1000)  alum & mag hydroxide-simeth  (MAALOX/MYLANTA) 200-200-20 MG/5ML suspension 30 mL (30 mLs Oral Given 07/26/20 0756)    And  lidocaine (XYLOCAINE) 2 % viscous mouth solution 15 mL (15 mLs Oral Given 07/26/20 0756)  iohexol (OMNIPAQUE) 300 MG/ML solution 100 mL (100 mLs Intravenous Contrast Given 07/26/20 0852)  cephALEXin (KEFLEX) capsule 500 mg (500 mg Oral Given 07/26/20 1202)    ED Course  I have reviewed the triage vital signs and the nursing notes.  Pertinent labs & imaging results that were available during my care of the patient were reviewed by me and considered in my medical decision making (see chart for details).  This patient presents to the Emergency Department with complaint of abdominal pain. This involves an extensive number of treatment options, and is a complaint that carries with it a high risk of complications and morbidity.  The differential diagnosis includes, but is not limited to, gastritis vs biliary disease vs peptic ulcer vs constipation vs colitis vs UTI vs other  No guarding on abdominal exam, no focal findings to suggest acute appendicitis, pyelonephritis, or cholecystitis.    However with her symptoms ongoing for several weeks, I do think it be reasonable to obtain more of the work-up.  Labs pending.  IV Phenergan ordered for nausea.  GI cocktail with lidocaine ordered for her suspected gastritis 2/2 vomiting.  The patient only completed half a weeks treatment for Giardia due to medications side effects.  However without persistent daily diarrhea and fevers this seems less likely an  acute cause of her symptoms.  I ordered, reviewed, and interpreted labs, showing WBC elevated to 16K, otherwise CMP, Lipase, UA unremarkable  Preg negative I ordered imaging studies which included CT abdomen/pelvis I independently visualized and interpreted imaging which showed no acute intra-abdominal abnormalities Previous records obtained and reviewed showing prior CT, adnexal cyst, simple, 3 years ago, ED visit in  May 2022 w/ workup, including xray abdominal series which was read as unremarkable.  After the interventions stated above, I reevaluated the patient and found that they remained clinically stable.  Based on the patient's clinical exam, vital signs, risk factors, and ED testing, I felt that the patient's overall risk of life-threatening emergency such as bowel perforation, surgical emergency, ovarian torsion, PID, or sepsis was quite low.   I discussed outpatient follow up with primary care provider, and provided specialist office number on the patient's discharge paper if a referral was deemed necessary.  I discussed return precautions with the patient. I felt the patient was clinically stable for discharge.   Clinical Course as of 07/26/20 1726  Sat Jul 26, 2020  0347 Discussed with the patient her lab work.  Her white blood cell count appears to be elevated from 4 weeks ago.  I do think at this time would be reasonable to obtain a CT scan of the abdomen, she verbalized agreement. [MT]  1012 Patient reports significant improvement of her pain after the medications.  We discussed the pelvic ultrasound to evaluate for signs of torsion, although her CT scan is unremarkable, her description of being doubled over with severe pain does raise some concern for torsion.  And she has PCOS which may be a risk factor.  Therefore she has agreed to proceed with pelvic imaging.  We also discussed her incomplete treatment for Giardia, and her leukocytosis today - we'll try to send a stool sample if she's able to. [MT]  4259 IMPRESSION: Normal pelvic ultrasound.  No mass or torsion. [MT]  1115 Her work-up is completed and essentially unremarkable.  She continues to have no focal abdominal tenderness on exam to make me suspect an acute biliary disease or appendicitis, with unremarkable imaging as well. [MT]  5638 She has been feeling well since she got here.  She does not feel like she can provide a stool sample for  the ova and parasites.  We discussed her UA, with leukocytes and hemoglobin, may be indicative of UTI.  We will start on a course of Keflex.  I will also provide her Phenergan as this seemed to help her the most. [MT]  1154 She can follow-up with her PCP or gastroenterologist.  Information provided.  Return precautions. [MT]    Clinical Course User Index [MT] Wyvonnia Dusky, MD     Final Clinical Impression(s) / ED Diagnoses Final diagnoses:  Abdominal pain, unspecified abdominal location  Non-intractable vomiting with nausea, unspecified vomiting type    Rx / DC Orders ED Discharge Orders          Ordered    cephALEXin (KEFLEX) 500 MG capsule  2 times daily        07/26/20 1156    promethazine (PHENERGAN) 25 MG tablet  Every 6 hours PRN        07/26/20 1156    fluconazole (DIFLUCAN) 200 MG tablet   Once        07/26/20 1156             Wyvonnia Dusky, MD 07/26/20 1726

## 2021-01-23 ENCOUNTER — Encounter (HOSPITAL_COMMUNITY): Payer: Self-pay | Admitting: *Deleted

## 2021-01-23 ENCOUNTER — Emergency Department (HOSPITAL_COMMUNITY)
Admission: EM | Admit: 2021-01-23 | Discharge: 2021-01-23 | Disposition: A | Discharge status: Home / Self Care | Payer: Medicaid Other | Attending: Emergency Medicine | Admitting: Emergency Medicine

## 2021-01-23 ENCOUNTER — Other Ambulatory Visit: Payer: Self-pay

## 2021-01-23 DIAGNOSIS — R197 Diarrhea, unspecified: Secondary | ICD-10-CM | POA: Insufficient documentation

## 2021-01-23 DIAGNOSIS — Z87891 Personal history of nicotine dependence: Secondary | ICD-10-CM | POA: Insufficient documentation

## 2021-01-23 DIAGNOSIS — R1013 Epigastric pain: Secondary | ICD-10-CM | POA: Insufficient documentation

## 2021-01-23 DIAGNOSIS — Z20822 Contact with and (suspected) exposure to covid-19: Secondary | ICD-10-CM | POA: Insufficient documentation

## 2021-01-23 DIAGNOSIS — R112 Nausea with vomiting, unspecified: Secondary | ICD-10-CM | POA: Insufficient documentation

## 2021-01-23 DIAGNOSIS — I10 Essential (primary) hypertension: Secondary | ICD-10-CM | POA: Insufficient documentation

## 2021-01-23 DIAGNOSIS — R109 Unspecified abdominal pain: Secondary | ICD-10-CM

## 2021-01-23 DIAGNOSIS — R350 Frequency of micturition: Secondary | ICD-10-CM | POA: Insufficient documentation

## 2021-01-23 HISTORY — DX: Polycystic ovarian syndrome: E28.2

## 2021-01-23 LAB — RESP PANEL BY RT-PCR (FLU A&B, COVID) ARPGX2
Influenza A by PCR: NEGATIVE
Influenza B by PCR: NEGATIVE
SARS Coronavirus 2 by RT PCR: NEGATIVE

## 2021-01-23 LAB — CBC WITH DIFFERENTIAL/PLATELET
Abs Immature Granulocytes: 0.03 10*3/uL (ref 0.00–0.07)
Basophils Absolute: 0 10*3/uL (ref 0.0–0.1)
Basophils Relative: 0 %
Eosinophils Absolute: 0 10*3/uL (ref 0.0–0.5)
Eosinophils Relative: 0 %
HCT: 41.5 % (ref 36.0–46.0)
Hemoglobin: 14 g/dL (ref 12.0–15.0)
Immature Granulocytes: 0 %
Lymphocytes Relative: 9 %
Lymphs Abs: 0.9 10*3/uL (ref 0.7–4.0)
MCH: 28 pg (ref 26.0–34.0)
MCHC: 33.7 g/dL (ref 30.0–36.0)
MCV: 83 fL (ref 80.0–100.0)
Monocytes Absolute: 0.6 10*3/uL (ref 0.1–1.0)
Monocytes Relative: 6 %
Neutro Abs: 8.2 10*3/uL — ABNORMAL HIGH (ref 1.7–7.7)
Neutrophils Relative %: 85 %
Platelets: 231 10*3/uL (ref 150–400)
RBC: 5 MIL/uL (ref 3.87–5.11)
RDW: 13 % (ref 11.5–15.5)
WBC: 9.8 10*3/uL (ref 4.0–10.5)
nRBC: 0 % (ref 0.0–0.2)

## 2021-01-23 LAB — COMPREHENSIVE METABOLIC PANEL
ALT: 21 U/L (ref 0–44)
AST: 22 U/L (ref 15–41)
Albumin: 4.6 g/dL (ref 3.5–5.0)
Alkaline Phosphatase: 49 U/L (ref 38–126)
Anion gap: 9 (ref 5–15)
BUN: 9 mg/dL (ref 6–20)
CO2: 22 mmol/L (ref 22–32)
Calcium: 8.9 mg/dL (ref 8.9–10.3)
Chloride: 107 mmol/L (ref 98–111)
Creatinine, Ser: 0.56 mg/dL (ref 0.44–1.00)
GFR, Estimated: >60 mL/min (ref >60)
Glucose, Bld: 99 mg/dL (ref 70–99)
Potassium: 3.4 mmol/L — ABNORMAL LOW (ref 3.5–5.1)
Sodium: 138 mmol/L (ref 135–145)
Total Bilirubin: 0.3 mg/dL (ref 0.3–1.2)
Total Protein: 7.7 g/dL (ref 6.5–8.1)

## 2021-01-23 LAB — LIPASE, BLOOD: Lipase: 35 U/L (ref 11–51)

## 2021-01-23 LAB — HCG, SERUM, QUALITATIVE: Preg, Serum: NEGATIVE

## 2021-01-23 MED ORDER — METOCLOPRAMIDE HCL 5 MG/ML IJ SOLN
10.0000 mg | Freq: Once | INTRAMUSCULAR | Status: AC
  Administered 2021-01-23: 09:00:00 10 mg via INTRAVENOUS
  Filled 2021-01-23: qty 2

## 2021-01-23 MED ORDER — DIPHENHYDRAMINE HCL 50 MG/ML IJ SOLN
25.0000 mg | Freq: Once | INTRAMUSCULAR | Status: AC
  Administered 2021-01-23: 09:00:00 25 mg via INTRAVENOUS
  Filled 2021-01-23: qty 1

## 2021-01-23 MED ORDER — SODIUM CHLORIDE 0.9 % IV BOLUS
1000.0000 mL | Freq: Once | INTRAVENOUS | Status: AC
  Administered 2021-01-23: 09:00:00 1000 mL via INTRAVENOUS

## 2021-01-23 NOTE — ED Provider Notes (Signed)
[facility] EMERGENCY DEPARTMENT Provider Note   CSN: [id number] Arrival date & time: 01/23/21  3976     History Chief Complaint  Patient presents with   Abdominal Pain    Anthem is a 20 y.o. female.  Patient is a 20 yo female with of IBS presenting for abdominal pain. Admits to epigastric abdominal pain that awakes her from sleep due to vomiting, intermittent, twisting/squeezing in nature, severe, non radiating, denies alcohol use this week. Admits to diarrhea x 3 days. Denies fevers/chills, Denies flank pain, hx of renal stones, or hematuria. Admits to increased urinary frequency. States this is unlike her normal episodes of IBS.    The history is provided by the patient. No language interpreter was used.  Abdominal Pain Associated symptoms: diarrhea, nausea and vomiting   Associated symptoms: no chest pain, no chills, no cough, no dysuria, no fever, no hematuria, no shortness of breath and no sore throat       Past Medical History:  Diagnosis Date   Anxiety    Bipolar depression ([place])    Borderline personality disorder ([place])    Depression    GERD (gastroesophageal reflux disease)    Hypertension    IBS (irritable bowel syndrome)    PCOS (polycystic ovarian syndrome)     Patient Active Problem List   Diagnosis Date Noted   Abdominal pain of multiple sites 03/11/2017   Irritable bowel syndrome with both constipation and diarrhea 12/14/2016   Dyspareunia in female 12/14/2016   Dysmenorrhea 12/14/2016   Yeast infection of the vagina 12/14/2016   Vaginal discharge 12/14/2016   Depression 09/22/2012   Generalized anxiety disorder 09/22/2012   Family history of bipolar disorder 09/22/2012   Family history of depression 73/41/9379   Lice 02/40/9735   Dyspepsia 09/08/2012   GERD (gastroesophageal reflux disease) 09/08/2012    Past Surgical History:  Procedure Laterality Date   None       OB History     Gravida  0   Para  0   Term  0   Preterm   0   AB  0   Living  0      SAB  0   IAB  0   Ectopic  0   Multiple  0   Live Births  0           Family History  Problem Relation Age of Onset   Depression Father    Irritable bowel syndrome Father    Mental illness Mother    Cancer Mother        ovary   Endometriosis Mother    COPD Maternal Grandmother    Cancer Maternal Grandmother        ovary   Colon cancer Neg Hx     Social History   Tobacco Use   Smoking status: Former    Packs/day: 0.00    Types: Cigarettes   Smokeless tobacco: Never  Vaping Use   Vaping Use: Never used  Substance Use Topics   Alcohol use: Yes    Comment: once monthly   Drug use: Not Currently    Frequency: 1.0 times per week    Types: Marijuana    Home Medications Prior to Admission medications   Medication Sig Start Date End Date Taking? Authorizing Provider  benzonatate (TESSALON) 100 MG capsule Take 1 capsule (100 mg total) by mouth 2 (two) times daily as needed for cough. 09/15/18   Hedges, Dellis Filbert, PA-C  ciprofloxacin-dexamethasone ([facility]) OTIC  suspension Place 4 drops into the right ear 2 (two) times daily. 02/20/18   Sharion Balloon, FNP  DIPHENHYDRAMINE CITRATE PO Take by mouth.    [provider]  fluconazole (DIFLUCAN) 200 MG tablet Take one dose by mouth, wait 72 hours, and then take second dose by mouth 08/17/18   Wurst, [place], PA-C  hydrOXYzine (ATARAX/VISTARIL) 25 MG tablet Take 25 mg by mouth at bedtime.     [provider]  ibuprofen (ADVIL,MOTRIN) 600 MG tablet Take 1 tablet (600 mg total) by mouth 4 (four) times daily. 10/05/17   Lily Kocher, PA-C  loperamide (IMODIUM A-D) 2 MG tablet Take 2 mg by mouth 4 (four) times daily as needed for diarrhea or loose stools.    [provider]  LORazepam (ATIVAN) 1 MG tablet Take 1 mg by mouth 2 (two) times daily.    [provider]  metroNIDAZOLE (FLAGYL) 500 MG tablet Take 1 tablet (500 mg total) by mouth 2 (two) times daily.  06/20/20   Evalee Jefferson, PA-C  norgestimate-ethinyl estradiol (SPRINTEC 28) 0.25-35 MG-MCG tablet Take 1 tablet by mouth daily. 12/16/17   Estill Dooms, NP  ondansetron (ZOFRAN) 4 MG tablet Take 4 mg by mouth 3 (three) times daily as needed. 11/09/19   [provider]  pantoprazole (PROTONIX) 40 MG tablet Take 40 mg by mouth at bedtime.    [provider]  promethazine (PHENERGAN) 25 MG tablet Take 1 tablet (25 mg total) by mouth every 6 (six) hours as needed for up to 10 doses for nausea or vomiting. 07/26/20   Wyvonnia Dusky, MD  Simethicone (GAS-X PO) Take 1 tablet by mouth daily.    [provider]  hyoscyamine (LEVSIN, ANASPAZ) 0.125 MG tablet Take 1 tablet (0.125 mg total) by mouth every 6 (six) hours as needed. Patient not taking: Reported on 12/16/2017 12/14/16 08/17/18  Estill Dooms, NP    Allergies    Other  Review of Systems   Review of Systems  Constitutional:  Negative for chills and fever.  HENT:  Negative for ear pain and sore throat.   Eyes:  Negative for pain and visual disturbance.  Respiratory:  Negative for cough and shortness of breath.   Cardiovascular:  Negative for chest pain and palpitations.  Gastrointestinal:  Positive for abdominal pain, diarrhea, nausea and vomiting.  Genitourinary:  Negative for dysuria and hematuria.  Musculoskeletal:  Negative for arthralgias and back pain.  Skin:  Negative for color change and rash.  Neurological:  Negative for seizures and syncope.  All other systems reviewed and are negative.  Physical Exam Updated Vital Signs BP (!) 117/100    Pulse 97    Temp 98.7 F (37.1 C) (Oral)    Resp 19    Ht 5\' 2"  (1.575 m)    Wt 86.2 kg    LMP 01/18/2021    SpO2 99%    BMI 34.75 kg/m   Physical Exam Vitals and nursing note reviewed.  Constitutional:      General: She is not in acute distress.    Appearance: She is well-developed.  HENT:     Head: Normocephalic and atraumatic.  Eyes:      Conjunctiva/sclera: Conjunctivae normal.  Cardiovascular:     Rate and Rhythm: Normal rate and regular rhythm.     Heart sounds: No murmur heard. Pulmonary:     Effort: Pulmonary effort is normal. No respiratory distress.     Breath sounds: Normal breath sounds.  Abdominal:  Palpations: Abdomen is soft.     Tenderness: There is no abdominal tenderness.  Musculoskeletal:        General: No swelling.     Cervical back: Neck supple.  Skin:    General: Skin is warm and dry.     Capillary Refill: Capillary refill takes less than 2 seconds.  Neurological:     Mental Status: She is alert.  Psychiatric:        Mood and Affect: Mood normal.    ED Results / Procedures / Treatments   Labs (all labs ordered are listed, but only abnormal results are displayed) Labs Reviewed  COMPREHENSIVE METABOLIC PANEL - Abnormal; Notable for the following components:      Result Value   Potassium 3.4 (*)    All other components within normal limits  CBC WITH DIFFERENTIAL/PLATELET - Abnormal; Notable for the following components:   Neutro Abs 8.2 (*)    All other components within normal limits  RESP PANEL BY RT-PCR (FLU A&B, COVID) ARPGX2  LIPASE, BLOOD  HCG, SERUM, QUALITATIVE    EKG None  Radiology No results found.  Procedures Procedures   Medications Ordered in ED Medications  metoCLOPramide (REGLAN) injection 10 mg (10 mg Intravenous Given 01/23/21 0928)  diphenhydrAMINE (BENADRYL) injection 25 mg (25 mg Intravenous Given 01/23/21 0928)  sodium chloride 0.9 % bolus 1,000 mL (1,000 mLs Intravenous Bolus 01/23/21 3559)    ED Course  I have reviewed the triage vital signs and the nursing notes.  Pertinent labs & imaging results that were available during my care of the patient were reviewed by me and considered in my medical decision making (see chart for details).    MDM Rules/Calculators/A&P  1:04 PM  20 yo female with of IBS presenting for epigastric abd  pain/n/v/diarrhea/urinaryfrequency. Pt Aox3, no acute distress, afebrile, with stable vitals. Physical exam demonstrates soft non tender abdomen   Preg negative Patient has soft abdomen. Liver profile, lipase, and renal function stable. Pt has hx of multiple abdominal images for similar complaints with negative scans. Due to patient's stable appearance repeat imaging dicussed with patient with decision to hold off. Patient given iv fluids, reglan, and benadryl for nausea and vomiting. On re-evaluation pt admits to complete resolution of symptoms. UA offered for urinary frequency symptoms but declined. Pt recommended for close follow up with GI specialist if symptoms of n/v/abdominal pain continue. Covid/flu was negative.  Patient in no distress and overall condition improved here in the ED. Detailed discussions were had with the patient regarding current findings, and need for close f/u with PCP or on call doctor. The patient has been instructed to return immediately if the symptoms worsen in any way for re-evaluation. Patient verbalized understanding and is in agreement with current care plan. All questions answered prior to discharge.    Final Clinical Impression(s) / ED Diagnoses Final diagnoses:  Nausea and vomiting, unspecified vomiting type  Diarrhea, unspecified type  Abdominal pain, unspecified abdominal location    Rx / DC Orders ED Discharge Orders     None        Lianne Cure, DO 74/16/38 1304

## 2021-01-23 NOTE — ED Triage Notes (Signed)
Pt c/o epigastric pain with n/v that started 1 hour ago. Pt reports this has happened 3 times in the past few months and came to the ED without being given an diagnosis. Was supposed to f/u with GI, but doesn't have insurance.

## 2021-01-25 HISTORY — PX: UPPER GI ENDOSCOPY: SHX6162

## 2021-01-28 ENCOUNTER — Encounter: Payer: Self-pay | Admitting: Gastroenterology

## 2021-01-28 ENCOUNTER — Telehealth: Payer: Self-pay

## 2021-01-28 NOTE — Telephone Encounter (Signed)
Called and LM for patient to call back to schedule a new pt appointment w/ Dr. Havery Moros as hosp F/U abdominal pain   Armbruster, Carlota Raspberry, MD  Roetta Sessions, CMA Jan can you please forward this to the schedulers to booking a new patient consult? Thanks

## 2021-03-04 ENCOUNTER — Encounter: Payer: Self-pay | Admitting: Gastroenterology

## 2021-03-04 ENCOUNTER — Other Ambulatory Visit (INDEPENDENT_AMBULATORY_CARE_PROVIDER_SITE_OTHER): Payer: No Typology Code available for payment source

## 2021-03-04 ENCOUNTER — Ambulatory Visit (INDEPENDENT_AMBULATORY_CARE_PROVIDER_SITE_OTHER): Payer: No Typology Code available for payment source | Admitting: Gastroenterology

## 2021-03-04 VITALS — BP 120/78 | HR 72 | Ht 62.0 in | Wt 178.4 lb

## 2021-03-04 DIAGNOSIS — R194 Change in bowel habit: Secondary | ICD-10-CM

## 2021-03-04 DIAGNOSIS — R110 Nausea: Secondary | ICD-10-CM

## 2021-03-04 DIAGNOSIS — R109 Unspecified abdominal pain: Secondary | ICD-10-CM | POA: Diagnosis not present

## 2021-03-04 LAB — TSH: TSH: 1.03 u[IU]/mL (ref 0.35–5.50)

## 2021-03-04 MED ORDER — ONDANSETRON 4 MG PO TBDP: 4.0000 mg | ORAL_TABLET | Freq: Three times a day (TID) | ORAL | 2 refills | Status: DC | PRN

## 2021-03-04 MED ORDER — SUTAB 1479-225-188 MG PO TABS: 1.0000 | ORAL_TABLET | Freq: Once | ORAL | 0 refills | Status: AC

## 2021-03-04 MED ORDER — OMEPRAZOLE 40 MG PO CPDR: 40.0000 mg | DELAYED_RELEASE_CAPSULE | Freq: Every day | ORAL | 0 refills | Status: DC

## 2021-03-04 MED ORDER — AMITRIPTYLINE HCL 10 MG PO TABS: 20.0000 mg | ORAL_TABLET | Freq: Every day | ORAL | 0 refills | Status: DC

## 2021-03-04 NOTE — Patient Instructions (Addendum)
If you are age 23 or older, your body mass index should be between 23-30. Your Body mass index is 32.63 kg/m. If this is out of the aforementioned range listed, please consider follow up with your Primary Care Provider.  If you are age 20 or younger, your body mass index should be between 19-25. Your Body mass index is 32.63 kg/m. If this is out of the aformentioned range listed, please consider follow up with your Primary Care Provider.   ________________________________________________________  The [facility] providers would like to encourage you to use [facility] to communicate with providers for non-urgent requests or questions.  Due to long hold times on the telephone, sending your provider a message by [facility] may be a faster and more efficient way to get a response.  Please allow 48 business hours for a response.  Please remember that this is for non-urgent requests.  _______________________________________________________   Dennis Bast have been scheduled for an endoscopy and colonoscopy. Please follow the written instructions given to you at your visit today. Please pick up your prep supplies at the pharmacy within the next 1-3 days. If you use inhalers (even only as needed), please bring them with you on the day of your procedure.   Please go to the lab in the basement of our building to have lab work done as you leave today. Hit "B" for basement when you get on the elevator.  When the doors open the lab is on your left.  We will call you with the results. Thank you.  Stop Pepcid.  We have sent the following medications to your pharmacy for you to pick up at your convenience: Omeprazole 40 mg: Take daily Zofran 4 mg ODT: Take every 8 hours as needed (recommend you take twice a day on a schedule) Bentyl 10 mg: Take every 8 hours as needed (recommend you take every am and then again as needed throughout the day) Elavil 10 mg: Take 2 tablets daily at bedtime (20 mg)  Stop Imodium.  Please  follow up in 2 months.  Thank you for entrusting me with your care and for choosing [facility], Dr. [place] Cellar

## 2021-03-04 NOTE — Progress Notes (Signed)
HPI :  21 year old female with a history of anxiety, depression, reported IBS, PCOS, referred here by Dr. Lemmie Evens for abdominal pain, altered bowel habits, nausea and vomiting.  This is the patient's first time to our office.  She endorses multiple chronic GI tract symptoms that initially started she thinks back in age 61.  She states as a child she had "stomach problems".  She states in 2014 she started feeling crampy abdominal pain daily with urgency of her stools and ended up missing a lot of school in her high school days due to symptoms.  She has felt nauseous in the morning as long as she can remember when she first wakes up, often would have vomiting with brushing her teeth in the past.  She states was evaluated by primary care in the past and told she had IBS but states she really did not try any therapy to treat this.  Over time her symptoms have become worse and more severe.  She always has some level of some discomfort in her abdomen associated with bloating and gas.  Previously she had loose stools predominant, now she has altered bowel habits that can go back and forth although she does use Imodium on a fairly frequent basis.  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  This can sometimes lead to not having a bowel movement for few days at a time.  She denies any blood in her stool.  She states her symptoms can vary with her anxiety level, she does think there is a link between anxiety and how her stomach feeling.  She does endorse a constant dull discomfort in her lower abdomen but can happen diffusely.  She has had episodes where she has very severe pain that bothers her much more than usual.  She can sometimes wake up with severe abdominal pains and nausea.  She has been to the ER about 5 or 6 times since last May per her report for flare  of symptoms.  She was most recently seen in the emergency room a few weeks ago.  I do not have all the records from each of these ER visits but most recently her LFTs were normal, lipase was mildly elevated but not meeting criteria for pancreatitis, hemoglobin and white blood cell count normal.  She has had a few CT scans over the years.  The last was done in July 2022 which was normal.  She is also had normal pelvic ultrasound and right upper quadrant ultrasound.  She states she has constant "24/7" for the past year or so.  She actually does not vomit too much, only when the pain is severe.  She denies any NSAID use at baseline.  She does have occasional pyrosis that bothers her, particularly when lying down.  She has no family history of IBD, celiac disease, or colon cancer.  She thinks her father had IBS.  Her mother had ovarian cancer.  She denies any tobacco use.  She drinks alcohol only on the weekends.  She has rarely used marijuana in the past but does not use it routinely.  She has been taking Pepcid every morning and nightly over-the-counter, states that does help some but she continues to have some reflux despite this.  She has been trying to use Gas-X.  She has been on  Bentyl in the past and recently resumed it after her ED visit.  She has been taking it in the morning and thinks it does help somewhat.  She states she has been on some SSRIs in the past, currently not on anything but Ativan for anxiety.  She has Zofran and Phenergan written for her to take as needed but she does not really take much of it.  She does think Zofran helps when she takes it.  She is anxious about having future severe attacks that bring her to the hospital, these are very painful for her.  She inquires about doing an endoscopy and colonoscopy for peace of mind to make sure everything is okay given her persistent symptoms.  No fevers.  No weight loss. No bleeding symptoms.   Most recent imaging: CT scan abdomen / pelvis  07/26/20 -  IMPRESSION: No abnormality seen in the abdomen or pelvis.   Pelvic US 07/26/20: Normal  CT scan abdomen / pelvis 03/22/2016: IMPRESSION: 1. 3.7 cm cystic-appearing mass within the right adnexa, presumably small benign ovarian cyst. Per consensus guidelines, recommend follow-up pelvic ultrasound in 6-12 weeks to ensure resolution. Recommend pelvic ultrasound sooner if symptoms worsen. 2. Remainder of the abdomen and pelvis CT is unremarkable, as detailed above  RUQ [place] 05/30/2013: No gallstones, normal liver   Past Medical History:  Diagnosis Date   Anxiety    Bipolar depression ([place])    Borderline personality disorder ([place])    Depression    GERD (gastroesophageal reflux disease)    Hypertension    IBS (irritable bowel syndrome)    PCOS (polycystic ovarian syndrome)      Past Surgical History:  Procedure Laterality Date   None     Family History  Problem Relation Age of Onset   Depression Father    Irritable bowel syndrome Father    Mental illness Mother    Cancer Mother        ovary   Endometriosis Mother    COPD Maternal Grandmother    Cancer Maternal Grandmother        ovary   Colon cancer Neg Hx    Social History   Tobacco Use   Smoking status: Never   Smokeless tobacco: Never  Vaping Use   Vaping Use: Never used  Substance Use Topics   Alcohol use: Yes    Comment: once monthly   Drug use: Not Currently    Frequency: 1.0 times per week    Types: Marijuana   Current Outpatient Medications  Medication Sig Dispense Refill   dicyclomine (BENTYL) 20 MG tablet Take 20 mg by mouth 4 (four) times daily.     loperamide (IMODIUM A-D) 2 MG tablet Take 2 mg by mouth 4 (four) times daily as needed for diarrhea or loose stools.     LORazepam (ATIVAN) 1 MG tablet Take 1 mg by mouth 2 (two) times daily.     ondansetron (ZOFRAN) 4 MG tablet Take 4 mg by mouth 3 (three) times daily as needed.     pantoprazole (PROTONIX) 40 MG tablet Take 40 mg by mouth at  bedtime.     promethazine (PHENERGAN) 25 MG tablet Take 1 tablet (25 mg total) by mouth every 6 (six) hours as needed for up to 10 doses for nausea or vomiting. 10 tablet 0   Simethicone (GAS-X PO) Take 1 tablet by mouth daily.     No current facility-administered medications for this visit.   Allergies  Allergen Reactions   Other  Pt cannot remember which medicine makes her itch     Review of Systems: All systems reviewed and negative except where noted in HPI.   Lab Results  Component Value Date   WBC 9.8 01/23/2021   HGB 14.0 01/23/2021   HCT 41.5 01/23/2021   MCV 83.0 01/23/2021   PLT 231 01/23/2021    Lab Results  Component Value Date   CREATININE 0.56 01/23/2021   BUN 9 01/23/2021   NA 138 01/23/2021   K 3.4 (L) 01/23/2021   CL 107 01/23/2021   CO2 22 01/23/2021    Lab Results  Component Value Date   ALT 21 01/23/2021   AST 22 01/23/2021   ALKPHOS 49 01/23/2021   BILITOT 0.3 01/23/2021    Lab Results  Component Value Date   LIPASE 35 01/23/2021     Physical Exam: BP 120/78    Pulse 72    Ht 5\' 2"  (1.575 m)    Wt 178 lb 6.4 oz (80.9 kg)    BMI 32.63 kg/m  Constitutional: Pleasant,well-developed, female in no acute distress. HEENT: Normocephalic and atraumatic. Conjunctivae are normal. No scleral icterus. Neck supple.  Cardiovascular: Normal rate, regular rhythm.  Pulmonary/chest: Effort normal and breath sounds normal.  Abdominal: Soft, nondistended, mild tenderness in upper abdomen. There are no masses palpable.  Extremities: no edema Lymphadenopathy: No cervical adenopathy noted. Neurological: Alert and oriented to person place and time. Skin: Skin is warm and dry. No rashes noted. Psychiatric: Normal mood and affect. Behavior is normal.   ASSESSMENT AND PLAN: 21 year old female here for new patient assessment the following:  Chronic abdominal pain Altered bowel habits Chronic nausea  History as detailed above.  Longstanding chronic  symptoms of chronic nausea with altered bowel habits and chronic abdominal pain.  She has had episodes of severe worsening of this that has led to multiple ED visits in recent years, 2 CT scans without any concerning pathology, labs have looked okay.  No gallstones on prior ultrasound imaging.  We had a lengthy discussion about her course to date and ways we can get her feeling better moving forward.  I suspect is very likely that she has a functional bowel disorder and we discussed what that is, and ways to treat it.  She is rather anxious about having underlying Crohn's or colitis, really would like to have an endoscopy and colonoscopy done for peace of mind.  I discussed risks and benefits of endoscopic procedures and anesthesia.  I think reasonable to do an upper endoscopy to clear her upper GI tract in light of the chronic nausea, vomiting episodes she has been having, but CT scan very reassuring of her bowel, I think likelihood of IBD is very low.  That being said she is quite anxious about this and for peace of mind would really find benefit in doing a colonoscopy and strongly wanted to have that done.  If she is going to be sedated for an endoscopy she would want to have the colonoscopy.  We will go ahead and schedule this per her request but she will discuss with her insurance how much this may cost her.  Otherwise my plan is to screen her for celiac disease with labs to make sure okay, check TSH as I do not see that on file and to screen her for H. pylori with serology.  She has ongoing upper tract symptoms despite Pepcid, I would rather start her on a trial of PPI for 4 weeks and assess response,  will try omeprazole 40 mg once daily.  For her chronic nausea that responds to Zofran, okay to take Zofran a few times daily, will start 4 mg ODT twice daily.  Ultimately discussed options for her symptoms globally, I think she really may benefit from a TCA such as Elavil which should help with her chronic  pain.  We discussed this for a bit.  If starting this I think we should stop the Imodium, she is taking this preventatively but she may get more constipated if she continues to use this in the setting of Elavil.  She is agreeable to stop that and only use it as needed.  Bentyl has provided some help with her stomach cramps as well, she can continue that.  I would like to see her back in 2 months in the office for reassessment, discussed we may need to tweak her regimen moving forward pending her response.  She agrees  Plan: - lab for celiac testing, TSH, H pylori IgG - stop pepcid - start omeprazole 40mg  / day for 4-6 week trial - Zofran 4mg  ODT BID and PRN - start Elavil 20mg  q HS - will plan on titrating up over time pending her response, discussed risks - stop routine use of immodium, use only PRN - bentyl q AM and then PRN - she strongly wants to pursue EGD and colonoscopy as outlined - follow up in the office in 2 months for reassessment  Jolly Mango, MD [facility]  CC: Lemmie Evens, MD

## 2021-03-05 LAB — H. PYLORI ANTIBODY, IGG: H Pylori IgG: NEGATIVE

## 2021-03-05 LAB — TISSUE TRANSGLUTAMINASE, IGA: (tTG) Ab, IgA: <1 U/mL

## 2021-03-05 LAB — IGA: Immunoglobulin A: 107 mg/dL (ref 47–310)

## 2021-03-24 ENCOUNTER — Encounter: Payer: Self-pay | Admitting: Gastroenterology

## 2021-03-24 ENCOUNTER — Other Ambulatory Visit: Payer: Self-pay

## 2021-03-24 MED ORDER — DICYCLOMINE HCL 20 MG PO TABS: 20.0000 mg | ORAL_TABLET | Freq: Three times a day (TID) | ORAL | 1 refills | Status: DC | PRN

## 2021-03-24 NOTE — Progress Notes (Signed)
Refill of bentyl sent to pharmacy.

## 2021-03-25 MED ORDER — DULOXETINE HCL 30 MG PO CPEP
30.0000 mg | ORAL_CAPSULE | Freq: Every day | ORAL | 0 refills | Status: DC
Start: 2021-03-25 — End: 2021-05-08

## 2021-04-21 ENCOUNTER — Encounter: Payer: No Typology Code available for payment source | Admitting: Gastroenterology

## 2021-05-08 ENCOUNTER — Other Ambulatory Visit: Payer: Self-pay | Admitting: Gastroenterology

## 2021-05-08 ENCOUNTER — Encounter: Payer: Self-pay | Admitting: Gastroenterology

## 2021-05-08 NOTE — Telephone Encounter (Signed)
Dr. Havery Moros, ? ?Just giving you an FYI that the Duloxetine is working. I sent in a refill for her.  ?

## 2021-05-29 ENCOUNTER — Other Ambulatory Visit: Payer: Self-pay | Admitting: Gastroenterology

## 2021-07-24 ENCOUNTER — Encounter: Payer: Self-pay | Admitting: Gastroenterology

## 2021-07-24 ENCOUNTER — Ambulatory Visit (INDEPENDENT_AMBULATORY_CARE_PROVIDER_SITE_OTHER): Payer: No Typology Code available for payment source | Admitting: Gastroenterology

## 2021-07-24 VITALS — BP 130/80 | HR 88 | Ht 63.5 in | Wt 172.4 lb

## 2021-07-24 DIAGNOSIS — R194 Change in bowel habit: Secondary | ICD-10-CM | POA: Diagnosis not present

## 2021-07-24 DIAGNOSIS — R109 Unspecified abdominal pain: Secondary | ICD-10-CM | POA: Diagnosis not present

## 2021-07-24 DIAGNOSIS — R110 Nausea: Secondary | ICD-10-CM | POA: Diagnosis not present

## 2021-07-24 MED ORDER — OMEPRAZOLE 40 MG PO CPDR: 40.0000 mg | DELAYED_RELEASE_CAPSULE | Freq: Two times a day (BID) | ORAL | 2 refills | Status: DC

## 2021-07-24 MED ORDER — DULOXETINE HCL 30 MG PO CPEP: 30.0000 mg | ORAL_CAPSULE | Freq: Every day | ORAL | 2 refills | Status: DC

## 2021-07-24 MED ORDER — SUTAB 1479-225-188 MG PO TABS: 1.0000 | ORAL_TABLET | Freq: Once | ORAL | 0 refills | Status: AC

## 2021-07-24 MED ORDER — DICYCLOMINE HCL 20 MG PO TABS: 20.0000 mg | ORAL_TABLET | Freq: Four times a day (QID) | ORAL | 2 refills | Status: DC | PRN

## 2021-07-24 MED ORDER — ONDANSETRON HCL 4 MG PO TABS: 4.0000 mg | ORAL_TABLET | Freq: Three times a day (TID) | ORAL | 2 refills | Status: DC | PRN

## 2021-07-24 NOTE — Progress Notes (Signed)
HPI :  21 year old female here for a follow-up visit for abdominal pain, bowel problems, nausea vomiting.  Please refer to her intake history on March 04, 2021 for full history.  Recall she has had chronic abdominal pain, altered bowel habits, chronic nausea, for years.  She had associated anxiety with the symptoms.  She has had work-up with imaging to include CT scans and ultrasounds in the past.  At her last visit we had discussed doing an endoscopy and colonoscopy, she was scheduled for this but apparently canceled and could not follow through with that at the time.  Since her last visit she tested negative for celiac disease, normal thyroid, normal H. pylori IgG serology.  We stopped her Pepcid, started her on omeprazole.  Started some Zofran for nausea.  Recommended trial of Elavil 20 mg nightly.  Also started Bentyl as needed and Imodium as needed.  She reports she had "emotional" changes when she was on Elavil, had problems controlling anger, stopped it after about a week or so when she clearly was not tolerating it.  In its place I started her on Cymbalta 30 mg daily.  She states this actually worked really well to help her emotions and she does think this is generally provided some benefit for her.  Her primary care attempted to titrate this up to 60 mg daily but she states she felt "numb" on it in regards to her emotions and preferred the 30 mg dosing.  She continues to take that.  She is been taking Bentyl as needed for her pain which she states does help at times when she takes it.  The omeprazole she is on 40 mg nightly.  This is definitely helped her reflux symptoms and help settle her stomach somewhat.  She continues to get nauseated at times, using Zofran as needed.  She states this makes her a bit drowsy at times, I also have given her some Phenergan which she can take at night when she sleeps, tends to help her nocturnal symptoms.  She does continue to have abdominal pains that can  come and go.  Eating can make her upper abdomen hurt more so than lower abdomen lately.  She had been doing a bit better but had a severe episode of nausea with multiple episodes of vomiting earlier in May.  Her stomach had been a bit more upset since that time.  She feels more fatigued than usual.  She had poor appetite for a few weeks in May, had not been eating too much, lost about 10 pounds but she states since then her appetite is improved and she has regained the weight she lost.  She still remains nauseated most days, complains of chronic fatigue but does sleep well.  Denies snoring.  She denies any marijuana use, no NSAID use.  Her bowels continue to fluctuate between looser stools and more constipated stools.  She had strongly wanted to proceed with an endoscopy and colonoscopy after her last visit, although again did not schedule.      Most recent imaging: CT scan abdomen / pelvis 07/26/20 -  IMPRESSION: No abnormality seen in the abdomen or pelvis.   Pelvic US 07/26/20: Normal   CT scan abdomen / pelvis 03/22/2016: IMPRESSION: 1. 3.7 cm cystic-appearing mass within the right adnexa, presumably small benign ovarian cyst. Per consensus guidelines, recommend follow-up pelvic ultrasound in 6-12 weeks to ensure resolution. Recommend pelvic ultrasound sooner if symptoms worsen. 2. Remainder of the abdomen and pelvis CT is  unremarkable, as detailed above   RUQ [place] 05/30/2013: No gallstones, normal liver    Past Medical History:  Diagnosis Date   Anxiety    Bipolar depression ([place])    Borderline personality disorder ([place])    Depression    GERD (gastroesophageal reflux disease)    Hypertension    IBS (irritable bowel syndrome)    PCOS (polycystic ovarian syndrome)      Past Surgical History:  Procedure Laterality Date   None     Family History  Problem Relation Age of Onset   Depression Father    Irritable bowel syndrome Father    Mental illness Mother    Cancer Mother         ovary   Endometriosis Mother    COPD Maternal Grandmother    Cancer Maternal Grandmother        ovary   Colon cancer Neg Hx    Social History   Tobacco Use   Smoking status: Never   Smokeless tobacco: Never  Vaping Use   Vaping Use: Never used  Substance Use Topics   Alcohol use: Yes    Comment: once monthly   Drug use: Not Currently    Frequency: 1.0 times per week    Types: Marijuana   Current Outpatient Medications  Medication Sig Dispense Refill   Cholecalciferol (VITAMIN D3) 1.25 MG (50000 UT) CAPS Take 1 capsule by mouth once a week.     dicyclomine (BENTYL) 20 MG tablet TAKE 1 TABLET BY MOUTH EVERY 8 HOURS AS NEEDED FOR SPASMS. 60 tablet 2   DULoxetine (CYMBALTA) 30 MG capsule TAKE 1 CAPSULE BY MOUTH DAILY. 30 capsule 0   LORazepam (ATIVAN) 1 MG tablet Take 1 mg by mouth 2 (two) times daily.     omeprazole (PRILOSEC) 40 MG capsule TAKE 1 CAPSULE BY MOUTH DAILY. 30 capsule 5   ondansetron (ZOFRAN) 4 MG tablet Take 4 mg by mouth every 8 (eight) hours as needed.     promethazine (PHENERGAN) 25 MG tablet Take 1 tablet (25 mg total) by mouth every 6 (six) hours as needed for up to 10 doses for nausea or vomiting. 10 tablet 0   Simethicone (GAS-X PO) Take 1 tablet by mouth daily.     No current facility-administered medications for this visit.   Allergies  Allergen Reactions   Other     Pt cannot remember which medicine makes her itch     Review of Systems: All systems reviewed and negative except where noted in HPI.   Lab Results  Component Value Date   WBC 9.8 01/23/2021   HGB 14.0 01/23/2021   HCT 41.5 01/23/2021   MCV 83.0 01/23/2021   PLT 231 01/23/2021    Lab Results  Component Value Date   CREATININE 0.56 01/23/2021   BUN 9 01/23/2021   NA 138 01/23/2021   K 3.4 (L) 01/23/2021   CL 107 01/23/2021   CO2 22 01/23/2021    Lab Results  Component Value Date   ALT 21 01/23/2021   AST 22 01/23/2021   ALKPHOS 49 01/23/2021   BILITOT 0.3  01/23/2021     Physical Exam: BP 130/80 (BP Location: Left Arm, Patient Position: Sitting, Cuff Size: Normal)   Pulse 88   Ht 5' 3.5" (1.613 m) Comment: height measured without shoes  Wt 172 lb 6 oz (78.2 kg)   LMP 07/23/2021   BMI 30.06 kg/m  Constitutional: Pleasant,well-developed, female in no acute distress. Neurological: Alert and oriented to person  place and time. Psychiatric: Normal mood and affect. Behavior is normal.   ASSESSMENT AND PLAN: 21 year old female here for reassessment of the following:  Chronic nausea Abdominal pain Altered bowel habits  History as above, longstanding symptoms which I think is more than likely a functional bowel disorder however, she has had persistence of symptoms despite some therapy over the years.  Doing better since have last seen her although certainly not resolved, did have rather severe episode of nausea and vomiting back in May.  I have offered her an EGD and colonoscopy to further evaluate these chronic complaints, ensure no evidence of Crohn disease or IBD.  I have discussed risk benefits of endoscopy and anesthesia with her and she wants to proceed after discussion of options.  She does have postprandial upper abdominal pain along with this at times.  She has been evaluated for gallstones but that was several years ago.  Recommend another ultrasound of the right upper quadrant to ensure no biliary colic/gallstones.  She is agreeable to that.  In the interim, Cymbalta has definitely helped her, she did not think she tolerated higher dosing every day but wonder if we could alternate 30 mg 1 day and 60 mg the next.  She can try this and see how she feels with that.  I recommend that she increase her omeprazole to twice daily dosing until we can do her upper endoscopy as once daily has certainly provided some benefit.  She will continue Zofran and Phenergan as needed.  Can continue Bentyl as needed for pain and gave her some IBgard as needed to  see if that may help as well.  Plan: - schedule EGD and colonoscopy at [facility] - sutab - Cymbalta '30mg'$  / day and then alternate with '60mg'$  / day - increase omeprazole to '40mg'$  BID - continue Zofran - refill if needed - continue bentyl - provided samples of IB gard PRN - schedule RUQ [place] - rule out gallstones  Jolly Mango, MD [facility]enter[facility]

## 2021-07-24 NOTE — Patient Instructions (Addendum)
If you are age 23 or older, your body mass index should be between 23-30. Your Body mass index is 30.06 kg/m. If this is out of the aforementioned range listed, please consider follow up with your Primary Care Provider.  If you are age 64 or younger, your body mass index should be between 19-25. Your Body mass index is 30.06 kg/m. If this is out of the aformentioned range listed, please consider follow up with your Primary Care Provider.   ________________________________________________________   Cynthia Ross have been scheduled for an endoscopy and colonoscopy. Please follow the written instructions given to you at your visit today. Please pick up your prep supplies at the pharmacy within the next 1-3 days. If you use inhalers (even only as needed), please bring them with you on the day of your procedure. _________________________________________________________________________________  Cynthia Ross will be contacted by [employer] in the next 2 days to arrange abdominal Ultrasound.  The number on your caller ID will be [phone], please answer when they call.  If you have not heard from them in 2 days please call [phone] to schedule.     You have been scheduled for an abdominal ultrasound at [facility] (1st floor of hospital) on _______________ at ______________. Please arrive 15 minutes prior to your appointment for registration. Make certain not to have anything to eat or drink 6 hours prior to your appointment. Should you need to reschedule your appointment, please contact radiology at [phone]. This test typically takes about 30 minutes to perform. _________________________________________________________________________________  Please take Cymbalta 30 mg and 60 mg alternating days.  Increase your omeprazole to 40 mg twice daily.  Continue Zofran and Bentyl.  We have given you samples of the following medication to take:  IBGard - take as directed  Thank you  for entrusting me with your care and for choosing [facility], Dr. [facility] Cellar

## 2021-08-19 ENCOUNTER — Telehealth: Payer: Self-pay | Admitting: Gastroenterology

## 2021-08-19 MED ORDER — SUTAB 1479-225-188 MG PO TABS: 1.0000 | ORAL_TABLET | ORAL | 0 refills | Status: DC

## 2021-08-19 NOTE — Telephone Encounter (Signed)
Called and spoke to [employer]. They have been unable to reach patient.  Confirmed telephone #.  Resent Sutab to [employer].

## 2021-08-24 ENCOUNTER — Ambulatory Visit (AMBULATORY_SURGERY_CENTER): Payer: No Typology Code available for payment source | Admitting: Gastroenterology

## 2021-08-24 ENCOUNTER — Encounter: Payer: Self-pay | Admitting: Gastroenterology

## 2021-08-24 VITALS — BP 123/73 | HR 78 | Temp 98.6°F | Resp 15 | Ht 63.0 in | Wt 172.0 lb

## 2021-08-24 DIAGNOSIS — K648 Other hemorrhoids: Secondary | ICD-10-CM | POA: Diagnosis not present

## 2021-08-24 DIAGNOSIS — R110 Nausea: Secondary | ICD-10-CM

## 2021-08-24 DIAGNOSIS — R1084 Generalized abdominal pain: Secondary | ICD-10-CM | POA: Diagnosis not present

## 2021-08-24 DIAGNOSIS — R109 Unspecified abdominal pain: Secondary | ICD-10-CM

## 2021-08-24 DIAGNOSIS — K635 Polyp of colon: Secondary | ICD-10-CM

## 2021-08-24 DIAGNOSIS — K2960 Other gastritis without bleeding: Secondary | ICD-10-CM | POA: Diagnosis not present

## 2021-08-24 DIAGNOSIS — R194 Change in bowel habit: Secondary | ICD-10-CM | POA: Diagnosis not present

## 2021-08-24 DIAGNOSIS — D122 Benign neoplasm of ascending colon: Secondary | ICD-10-CM

## 2021-08-24 MED ORDER — SODIUM CHLORIDE 0.9 % IV SOLN: 500.0000 mL | Freq: Once | INTRAVENOUS | Status: DC

## 2021-08-24 NOTE — Op Note (Signed)
[place] Patient Name: Cynthia Ross Procedure Date: 08/24/2021 3:09 PM MRN: [id number] Endoscopist: Remo Lipps P. Havery Moros , MD Age: 21 Referring MD:  Date of Birth: Feb 16, 2000 Gender: Female Account #: [id number] Procedure:                Upper GI endoscopy Indications:              Abdominal pain, chronic nausea, altered bowel                            habits - improved on PPI and antiemetics but not                            resolved. Also on Cymbalta Medicines:                Monitored Anesthesia Care Procedure:                Pre-Anesthesia Assessment:                           - Prior to the procedure, a History and Physical                            was performed, and patient medications and                            allergies were reviewed. The patient's tolerance of                            previous anesthesia was also reviewed. The risks                            and benefits of the procedure and the sedation                            options and risks were discussed with the patient.                            All questions were answered, and informed consent                            was obtained. Prior Anticoagulants: The patient has                            taken no previous anticoagulant or antiplatelet                            agents. ASA Grade Assessment: II - A patient with                            mild systemic disease. After reviewing the risks                            and benefits, the patient was deemed in  satisfactory condition to undergo the procedure.                           After obtaining informed consent, the endoscope was                            passed under direct vision. Throughout the                            procedure, the patient's blood pressure, pulse, and                            oxygen saturations were monitored continuously. The                            Endoscope was introduced  through the mouth, and                            advanced to the second part of duodenum. The upper                            GI endoscopy was accomplished without difficulty.                            The patient tolerated the procedure well. Scope In: Scope Out: Findings:                 Esophagogastric landmarks were identified: the                            Z-line was found at 37 cm, the gastroesophageal                            junction was found at 37 cm and the upper extent of                            the gastric folds was found at 37 cm from the                            incisors.                           The exam of the esophagus was otherwise normal.                           The entire examined stomach was normal. Biopsies                            were taken with a cold forceps for Helicobacter                            pylori testing.                           The examined duodenum  was normal. Biopsies for                            histology were taken with a cold forceps for                            evaluation of celiac disease. Complications:            No immediate complications. Estimated blood loss:                            Minimal. Estimated Blood Loss:     Estimated blood loss was minimal. Impression:               - Esophagogastric landmarks identified.                           - Normal esophagus otherwise.                           - Normal stomach. Biopsied.                           - Normal examined duodenum. Biopsied.                           No anatomic or overt abnormalities noted on this                            exam. Will await biopsy results. Recommendation:           - Patient has a contact number available for                            emergencies. The signs and symptoms of potential                            delayed complications were discussed with the                            patient. Return to normal activities tomorrow.                             Written discharge instructions were provided to the                            patient.                           - Resume previous diet.                           - Continue present medications.                           - Await pathology results with further  recommendations Carlota Raspberry. Crystalyn Delia, MD 08/24/2021 3:59:02 PM This report has been signed electronically.

## 2021-08-24 NOTE — Progress Notes (Signed)
[facility] History and Physical   Primary Care Physician:  Cynthia Evens, MD   Reason for Procedure:   Chronic nausea, abdominal pain, altered bowel habits  Plan:    EGD and colonoscopy     HPI: Cynthia Ross is a 21 y.o. female  here for EGD and colonoscopy to evaluate symptoms as outlined above. Improved on medication regimen but not resolved.Otherwise feels well without any cardiopulmonary symptoms. today  I have discussed risks / benefits of anesthesia and endoscopic procedure with Cynthia Ross and they wish to proceed with the exams as outlined today.    Past Medical History:  Diagnosis Date   Anxiety    Bipolar depression ([place])    Borderline personality disorder ([place])    Depression    GERD (gastroesophageal reflux disease)    Hypertension    IBS (irritable bowel syndrome)    PCOS (polycystic ovarian syndrome)     Past Surgical History:  Procedure Laterality Date   None      Prior to Admission medications   Medication Sig Start Date End Date Taking? Authorizing Provider  dicyclomine (BENTYL) 20 MG tablet Take 1 tablet (20 mg total) by mouth every 6 (six) hours as needed for spasms. 07/24/21  Yes Cynthia Ross, Cynthia Raspberry, MD  DULoxetine (CYMBALTA) 30 MG capsule Take 1 capsule (30 mg total) by mouth daily. Alternate 30 mg and 60 mg daily 07/24/21  Yes Cynthia Ross, Cynthia Raspberry, MD  LORazepam (ATIVAN) 1 MG tablet Take 1 mg by mouth 2 (two) times daily.   Yes [provider]  omeprazole (PRILOSEC) 40 MG capsule Take 1 capsule (40 mg total) by mouth 2 (two) times daily. 07/24/21  Yes Cynthia Ross, Cynthia Raspberry, MD  ondansetron (ZOFRAN) 4 MG tablet Take 1 tablet (4 mg total) by mouth every 8 (eight) hours as needed. 07/24/21  Yes Cynthia Ross, Cynthia Raspberry, MD  promethazine (PHENERGAN) 25 MG tablet Take 1 tablet (25 mg total) by mouth every 6 (six) hours as needed for up to 10 doses for nausea or vomiting. 07/26/20  Yes Cynthia Dusky, MD  Simethicone (GAS-X PO) Take  1 tablet by mouth daily.   Yes [provider]  Cholecalciferol (VITAMIN D3) 1.25 MG (50000 UT) CAPS Take 1 capsule by mouth once a week. 07/03/21   [provider]  hyoscyamine (LEVSIN, ANASPAZ) 0.125 MG tablet Take 1 tablet (0.125 mg total) by mouth every 6 (six) hours as needed. Patient not taking: Reported on 12/16/2017 12/14/16 08/17/18  Cynthia Dooms, NP    Current Outpatient Medications  Medication Sig Dispense Refill   dicyclomine (BENTYL) 20 MG tablet Take 1 tablet (20 mg total) by mouth every 6 (six) hours as needed for spasms. 60 tablet 2   DULoxetine (CYMBALTA) 30 MG capsule Take 1 capsule (30 mg total) by mouth daily. Alternate 30 mg and 60 mg daily 45 capsule 2   LORazepam (ATIVAN) 1 MG tablet Take 1 mg by mouth 2 (two) times daily.     omeprazole (PRILOSEC) 40 MG capsule Take 1 capsule (40 mg total) by mouth 2 (two) times daily. 60 capsule 2   ondansetron (ZOFRAN) 4 MG tablet Take 1 tablet (4 mg total) by mouth every 8 (eight) hours as needed. 30 tablet 2   promethazine (PHENERGAN) 25 MG tablet Take 1 tablet (25 mg total) by mouth every 6 (six) hours as needed for up to 10 doses for nausea or vomiting. 10 tablet 0   Simethicone (GAS-X PO) Take 1 tablet by  mouth daily.     Cholecalciferol (VITAMIN D3) 1.25 MG (50000 UT) CAPS Take 1 capsule by mouth once a week.     Current Facility-Administered Medications  Medication Dose Route Frequency Provider Last Rate Last Admin   0.9 %  sodium chloride infusion  500 mL Intravenous Once Cynthia Ross, Cynthia Raspberry, MD        Allergies as of 08/24/2021 - Review Complete 08/24/2021  Allergen Reaction Noted   Other  08/17/2018    Family History  Problem Relation Age of Onset   Depression Father    Irritable bowel syndrome Father    Mental illness Mother    Cancer Mother        ovary   Endometriosis Mother    COPD Maternal Grandmother    Cancer Maternal Grandmother        ovary   Colon cancer Neg Hx     Social  History   Socioeconomic History   Marital status: Single    Spouse name: Not on file   Number of children: Not on file   Years of education: Not on file   Highest education level: Not on file  Occupational History   Not on file  Tobacco Use   Smoking status: Never   Smokeless tobacco: Never  Vaping Use   Vaping Use: Never used  Substance and Sexual Activity   Alcohol use: Yes    Comment: once monthly   Drug use: Not Currently    Frequency: 1.0 times per week    Types: Marijuana   Sexual activity: Not Currently    Birth control/protection: None  Other Topics Concern   Not on file  Social History Narrative   Not on file   Social Determinants of Health   Financial Resource Strain: Not on file  Food Insecurity: Not on file  Transportation Needs: Not on file  Physical Activity: Not on file  Stress: Not on file  Social Connections: Not on file  Intimate Partner Violence: Not on file    Review of Systems: All other review of systems negative except as mentioned in the HPI.  Physical Exam: Vital signs BP 119/75   Pulse 92   Temp 98.6 F (37 C) (Temporal)   Ht '5\' 3"'$  (1.6 m)   Wt 172 lb (78 kg)   SpO2 100%   BMI 30.47 kg/m   General:   Alert,  Well-developed, pleasant and cooperative in NAD Lungs:  Clear throughout to auscultation.   Heart:  Regular rate and rhythm Abdomen:  Soft, nontender and nondistended.   Neuro/Psych:  Alert and cooperative. Normal mood and affect. A and O x 3  Cynthia Mango, MD [facility]astroenter[facility]

## 2021-08-24 NOTE — Patient Instructions (Signed)
Handout on polyps given to patient. Await pathology results. Resume previous diet and continue present medications.   YOU HAD AN ENDOSCOPIC PROCEDURE TODAY AT [facility]:   Refer to the procedure report that was given to you for any specific questions about what was found during the examination.  If the procedure report does not answer your questions, please call your gastroenterologist to clarify.  If you requested that your care partner not be given the details of your procedure findings, then the procedure report has been included in a sealed envelope for you to review at your convenience later.  YOU SHOULD EXPECT: Some feelings of bloating in the abdomen. Passage of more gas than usual.  Walking can help get rid of the air that was put into your GI tract during the procedure and reduce the bloating. If you had a lower endoscopy (such as a colonoscopy or flexible sigmoidoscopy) you may notice spotting of blood in your stool or on the toilet paper. If you underwent a bowel prep for your procedure, you may not have a normal bowel movement for a few days.  Please Note:  You might notice some irritation and congestion in your nose or some drainage.  This is from the oxygen used during your procedure.  There is no need for concern and it should clear up in a day or so.  SYMPTOMS TO REPORT IMMEDIATELY:  Following lower endoscopy (colonoscopy or flexible sigmoidoscopy):  Excessive amounts of blood in the stool  Significant tenderness or worsening of abdominal pains  Swelling of the abdomen that is new, acute  Fever of 100F or higher  Following upper endoscopy (EGD)  Vomiting of blood or coffee ground material  New chest pain or pain under the shoulder blades  Painful or persistently difficult swallowing  New shortness of breath  Fever of 100F or higher  Black, tarry-looking stools  For urgent or emergent issues, a gastroenterologist can be reached at any hour by calling  [phone]. Do not use MyChart messaging for urgent concerns.    DIET:  We do recommend a small meal at first, but then you may proceed to your regular diet.  Drink plenty of fluids but you should avoid alcoholic beverages for 24 hours.  ACTIVITY:  You should plan to take it easy for the rest of today and you should NOT DRIVE or use heavy machinery until tomorrow (because of the sedation medicines used during the test).    FOLLOW UP: Our staff will call the number listed on your records the next business day following your procedure.  We will call around 7:15- 8:00 am to check on you and address any questions or concerns that you may have regarding the information given to you following your procedure. If we do not reach you, we will leave a message.  If you develop any symptoms (ie: fever, flu-like symptoms, shortness of breath, cough etc.) before then, please call [phone].  If you test positive for Covid 19 in the 2 weeks post procedure, please call and report this information to [place].    If any biopsies were taken you will be contacted by phone or by letter within the next 1-3 weeks.  Please call us at [phone] if you have not heard about the biopsies in 3 weeks.    SIGNATURES/CONFIDENTIALITY: You and/or your care partner have signed paperwork which will be entered into your electronic medical record.  These signatures attest to the fact that that the information  above on your After Visit Summary has been reviewed and is understood.  Full responsibility of the confidentiality of this discharge information lies with you and/or your care-partner.  

## 2021-08-24 NOTE — Progress Notes (Signed)
Called to room to assist during endoscopic procedure.  Patient ID and intended procedure confirmed with present staff. Received instructions for my participation in the procedure from the performing physician.  

## 2021-08-24 NOTE — Progress Notes (Signed)
Sedate, gd SR, tolerated procedure well, VSS, report to RN 

## 2021-08-24 NOTE — Op Note (Signed)
[place] Patient Name: Cynthia Ross Procedure Date: 08/24/2021 3:09 PM MRN: [id number] Endoscopist: Remo Lipps P. Havery Moros , MD Age: 21 Referring MD:  Date of Birth: [birth year] Gender: Female Account #: [id number] Procedure:                Colonoscopy Indications:              Abdominal pain, altered bowel habits - longstanding                            symptoms that have persisted over time, rule out                            Crohn's disease Medicines:                Monitored Anesthesia Care Procedure:                Pre-Anesthesia Assessment:                           - Prior to the procedure, a History and Physical                            was performed, and patient medications and                            allergies were reviewed. The patient's tolerance of                            previous anesthesia was also reviewed. The risks                            and benefits of the procedure and the sedation                            options and risks were discussed with the patient.                            All questions were answered, and informed consent                            was obtained. Prior Anticoagulants: The patient has                            taken no previous anticoagulant or antiplatelet                            agents. ASA Grade Assessment: II - A patient with                            mild systemic disease. After reviewing the risks                            and benefits, the patient was deemed in  satisfactory condition to undergo the procedure.                           After obtaining informed consent, the colonoscope                            was passed under direct vision. Throughout the                            procedure, the patient's blood pressure, pulse, and                            oxygen saturations were monitored continuously. The                            Olympus PCF-H190DL (#[id number])  Colonoscope was                            introduced through the anus and advanced to the the                            terminal ileum, with identification of the                            appendiceal orifice and IC valve. The colonoscopy                            was performed without difficulty. The patient                            tolerated the procedure well. The quality of the                            bowel preparation was adequate. The terminal ileum,                            ileocecal valve, appendiceal orifice, and rectum                            were photographed. Scope In: 3:25:21 PM Scope Out: 4:54:09 PM Scope Withdrawal Time: 0 hours 20 minutes 2 seconds  Total Procedure Duration: 0 hours 21 minutes 22 seconds  Findings:                 The perianal and digital rectal examinations were                            normal.                           The terminal ileum appeared normal.                           A 20 to 25 mm polyp was found in the ascending  colon. The polyp was sessile, grossly concerning                            for sessile serrated polyp. The polyp was removed                            with a piecemeal technique using a cold snare.                            Resection and retrieval were complete. Area just                            proximal to the lesion was tattooed with an                            injection of Spot (carbon black).                           Internal hemorrhoids were found during                            retroflexion. The hemorrhoids were small.                           The exam was otherwise without abnormality.                           Biopsies for histology were taken with a cold                            forceps from the right colon, left colon and                            transverse colon for evaluation of microscopic                            colitis. \ Complications:            No  immediate complications. Estimated blood loss:                            Minimal. Estimated Blood Loss:     Estimated blood loss was minimal. Impression:               - The examined portion of the ileum was normal.                           - One 20 to 25 mm polyp in the ascending colon,                            removed piecemeal using a cold snare. Resected and                            retrieved. Tattooed.                           -  Internal hemorrhoids.                           - The examination was otherwise normal.                           - Biopsies were taken with a cold forceps from the                            right colon, left colon and transverse colon for                            evaluation of microscopic colitis. Recommendation:           - Patient has a contact number available for                            emergencies. The signs and symptoms of potential                            delayed complications were discussed with the                            patient. Return to normal activities tomorrow.                            Written discharge instructions were provided to the                            patient.                           - Resume previous diet.                           - Continue present medications.                           - Await pathology results. Remo Lipps P. Levita Monical, MD 08/24/2021 3:55:21 PM This report has been signed electronically.

## 2021-08-25 ENCOUNTER — Telehealth: Payer: Self-pay

## 2021-08-25 NOTE — Telephone Encounter (Signed)
Unable to leave message. Mailbox is full ?

## 2021-10-19 ENCOUNTER — Ambulatory Visit
Admission: EM | Admit: 2021-10-19 | Discharge: 2021-10-19 | Disposition: A | Discharge status: Home / Self Care | Payer: Self-pay | Attending: Urgent Care | Admitting: Urgent Care

## 2021-10-19 DIAGNOSIS — J020 Streptococcal pharyngitis: Secondary | ICD-10-CM

## 2021-10-19 LAB — POCT RAPID STREP A (OFFICE): Rapid Strep A Screen: NEGATIVE

## 2021-10-19 MED ORDER — AMOXICILLIN-POT CLAVULANATE 875-125 MG PO TABS: 1.0000 | ORAL_TABLET | Freq: Two times a day (BID) | ORAL | 0 refills | Status: AC

## 2021-10-19 NOTE — ED Provider Notes (Addendum)
[name]    CSN: [id number] Arrival date & time: 10/19/21  0957      History   Chief Complaint Chief Complaint  Patient presents with   Sore Throat    Entered by patient   Cough   Nasal Congestion   Appointment    HPI Cynthia Ross is a 21 y.o. female.    Sore Throat  Cough   Presents to UC with report of cough, sore throat, congestion starting 2 weeks ago.  Patient also reports "white patches" on the back of her throat.  Taking OTC medication without relief.  Past Medical History:  Diagnosis Date   Anxiety    Bipolar depression ([place])    Borderline personality disorder ([place])    Depression    GERD (gastroesophageal reflux disease)    Hypertension    IBS (irritable bowel syndrome)    PCOS (polycystic ovarian syndrome)     Patient Active Problem List   Diagnosis Date Noted   Abdominal pain of multiple sites 03/11/2017   Irritable bowel syndrome with both constipation and diarrhea 12/14/2016   Dyspareunia in female 12/14/2016   Dysmenorrhea 12/14/2016   Yeast infection of the vagina 12/14/2016   Vaginal discharge 12/14/2016   Depression 09/22/2012   Generalized anxiety disorder 09/22/2012   Family history of bipolar disorder 09/22/2012   Family history of depression 75/64/3329   Lice 51/88/4166   Dyspepsia 09/08/2012   GERD (gastroesophageal reflux disease) 09/08/2012    Past Surgical History:  Procedure Laterality Date   None      OB History     Gravida  0   Para  0   Term  0   Preterm  0   AB  0   Living  0      SAB  0   IAB  0   Ectopic  0   Multiple  0   Live Births  0            Home Medications    Prior to Admission medications   Medication Sig Start Date End Date Taking? Authorizing Provider  Cholecalciferol (VITAMIN D3) 1.25 MG (50000 UT) CAPS Take 1 capsule by mouth once a week. 07/03/21   [provider]  dicyclomine (BENTYL) 20 MG tablet Take 1 tablet (20 mg total) by mouth every 6  (six) hours as needed for spasms. 07/24/21   Armbruster, Carlota Raspberry, MD  DULoxetine (CYMBALTA) 30 MG capsule Take 1 capsule (30 mg total) by mouth daily. Alternate 30 mg and 60 mg daily 07/24/21   Armbruster, Carlota Raspberry, MD  LORazepam (ATIVAN) 1 MG tablet Take 1 mg by mouth 2 (two) times daily.    [provider]  omeprazole (PRILOSEC) 40 MG capsule Take 1 capsule (40 mg total) by mouth 2 (two) times daily. 07/24/21   Armbruster, Carlota Raspberry, MD  ondansetron (ZOFRAN) 4 MG tablet Take 1 tablet (4 mg total) by mouth every 8 (eight) hours as needed. 07/24/21   Armbruster, Carlota Raspberry, MD  promethazine (PHENERGAN) 25 MG tablet Take 1 tablet (25 mg total) by mouth every 6 (six) hours as needed for up to 10 doses for nausea or vomiting. 07/26/20   Wyvonnia Dusky, MD  Simethicone (GAS-X PO) Take 1 tablet by mouth daily.    [provider]  hyoscyamine (LEVSIN, ANASPAZ) 0.125 MG tablet Take 1 tablet (0.125 mg total) by mouth every 6 (six) hours as needed. Patient not taking: Reported on 12/16/2017 12/14/16 08/17/18  Estill Dooms, NP    Family History Family History  Problem Relation Age of Onset   Depression Father    Irritable bowel syndrome Father    Mental illness Mother    Cancer Mother        ovary   Endometriosis Mother    COPD Maternal Grandmother    Cancer Maternal Grandmother        ovary   Colon cancer Neg Hx     Social History Social History   Tobacco Use   Smoking status: Never   Smokeless tobacco: Never  Vaping Use   Vaping Use: Never used  Substance Use Topics   Alcohol use: Yes    Comment: once monthly   Drug use: Not Currently    Frequency: 1.0 times per week    Types: Marijuana     Allergies   Other   Review of Systems Review of Systems  Respiratory:  Positive for cough.      Physical Exam Triage Vital Signs ED Triage Vitals  Enc Vitals Group     BP 10/19/21 1024 125/83     Pulse Rate 10/19/21 1024 98     Resp 10/19/21 1024 16     Temp  10/19/21 1024 (!) 97.5 F (36.4 C)     Temp src --      SpO2 10/19/21 1024 97 %     Weight --      Height --      Head Circumference --      Peak Flow --      Pain Score 10/19/21 1025 3     Pain Loc --      Pain Edu? --      Excl. in [place]? --    No data found.  Updated Vital Signs BP 125/83   Pulse 98   Temp (!) 97.5 F (36.4 C)   Resp 16   SpO2 97%   Visual Acuity Right Eye Distance:   Left Eye Distance:   Bilateral Distance:    Right Eye Near:   Left Eye Near:    Bilateral Near:     Physical Exam Vitals reviewed.  Constitutional:      Appearance: She is well-developed.  HENT:     Mouth/Throat:     Mouth: Mucous membranes are moist.     Pharynx: Posterior oropharyngeal erythema present.     Tonsils: Tonsillar abscess present. 3+ on the right. 0 on the left.  Eyes:     Conjunctiva/sclera: Conjunctivae normal.     Pupils: Pupils are equal, round, and reactive to light.  Skin:    General: Skin is warm and dry.  Neurological:     General: No focal deficit present.     Mental Status: She is alert and oriented to person, place, and time.  Psychiatric:        Mood and Affect: Mood normal.        Behavior: Behavior normal.      UC Treatments / Results  Labs (all labs ordered are listed, but only abnormal results are displayed) Labs Reviewed - No data to display  EKG   Radiology No results found.  Procedures Procedures (including critical care time)  Medications Ordered in UC Medications - No data to display  Initial Impression / Assessment and Plan / UC Course  I have reviewed the triage vital signs and the nursing notes.  Pertinent labs & imaging results that were available during my care of the patient were  reviewed by me and considered in my medical decision making (see chart for details).   Rapid strep is negative, however right tonsil is 3+ with visible abscess with exudate contained.  Suspect peritonsillar abscess.  Will treat with oral  antibiotics, however gave patient strict instructions to proceed to ED if her symptoms do not resolve with treatment or significantly improve after 72 hours.   Final Clinical Impressions(s) / UC Diagnoses   Final diagnoses:  None   Discharge Instructions   None    ED Prescriptions   None    PDMP not reviewed this encounter.   Rose Phi, FNP 10/19/21 1046    ImmordinoAnnie Main, [place] 10/19/21 1105

## 2021-10-19 NOTE — ED Triage Notes (Signed)
Pt. Is c/o a cough, sore throat and congestion that's started two weeks ago Pt. Is stating she has also noticed white patches on the back of her throat. Pt. Has been taking OTC medications w/ no relief.

## 2021-10-19 NOTE — Discharge Instructions (Addendum)
Please go to ED if your symptoms do not improve with treatment as you may require drainage of a suspected peritonsillar abscess.  Follow-up with your primary care provider as needed.

## 2021-10-29 ENCOUNTER — Ambulatory Visit: Payer: No Typology Code available for payment source | Admitting: Gastroenterology

## 2021-12-23 ENCOUNTER — Other Ambulatory Visit: Payer: Self-pay | Admitting: Gastroenterology

## 2022-03-27 ENCOUNTER — Other Ambulatory Visit: Payer: Self-pay | Admitting: Gastroenterology

## 2022-04-11 ENCOUNTER — Encounter: Payer: Self-pay | Admitting: Gastroenterology

## 2022-04-20 ENCOUNTER — Other Ambulatory Visit: Payer: Self-pay | Admitting: Nurse Practitioner

## 2022-04-20 ENCOUNTER — Other Ambulatory Visit (HOSPITAL_COMMUNITY)
Admission: RE | Admit: 2022-04-20 | Discharge: 2022-04-20 | Disposition: A | Discharge status: Home / Self Care | Payer: Medicaid Other | Source: Ambulatory Visit | Attending: Nurse Practitioner | Admitting: Nurse Practitioner

## 2022-04-20 DIAGNOSIS — Z124 Encounter for screening for malignant neoplasm of cervix: Secondary | ICD-10-CM | POA: Insufficient documentation

## 2022-04-22 LAB — CYTOLOGY - PAP: Diagnosis: NEGATIVE

## 2022-04-25 ENCOUNTER — Encounter: Payer: Self-pay | Admitting: Gastroenterology

## 2022-05-03 ENCOUNTER — Other Ambulatory Visit: Payer: Self-pay

## 2022-05-03 MED ORDER — DULOXETINE HCL 30 MG PO CPEP: ORAL_CAPSULE | ORAL | 0 refills | Status: DC

## 2022-05-03 MED ORDER — OMEPRAZOLE 40 MG PO CPDR: 40.0000 mg | DELAYED_RELEASE_CAPSULE | Freq: Two times a day (BID) | ORAL | 0 refills | Status: DC

## 2022-05-03 NOTE — Progress Notes (Signed)
[facility] said they transferred patient's prescriptions to [facility] in [place] per patient request however, [facility] says they don't have them. New script for Cymbalta and omeprazole sent to [facility]. Patient has appointment with [place] in 2 weeks on 4-25.

## 2022-05-20 ENCOUNTER — Encounter: Payer: Self-pay | Admitting: Gastroenterology

## 2022-05-20 ENCOUNTER — Ambulatory Visit: Payer: No Typology Code available for payment source | Admitting: Gastroenterology

## 2022-05-20 VITALS — BP 122/78 | HR 98 | Ht 63.0 in | Wt 208.0 lb

## 2022-05-20 DIAGNOSIS — R110 Nausea: Secondary | ICD-10-CM | POA: Diagnosis not present

## 2022-05-20 DIAGNOSIS — R194 Change in bowel habit: Secondary | ICD-10-CM | POA: Diagnosis not present

## 2022-05-20 DIAGNOSIS — K219 Gastro-esophageal reflux disease without esophagitis: Secondary | ICD-10-CM | POA: Diagnosis not present

## 2022-05-20 DIAGNOSIS — F32A Depression, unspecified: Secondary | ICD-10-CM

## 2022-05-20 DIAGNOSIS — Z86010 Personal history of colonic polyps: Secondary | ICD-10-CM

## 2022-05-20 MED ORDER — POLYETHYLENE GLYCOL 3350 17 G PO PACK: 17.0000 g | PACK | Freq: Every day | ORAL | 0 refills | Status: DC

## 2022-05-20 MED ORDER — OMEPRAZOLE 40 MG PO CPDR: 40.0000 mg | DELAYED_RELEASE_CAPSULE | Freq: Two times a day (BID) | ORAL | 3 refills | Status: DC

## 2022-05-20 MED ORDER — IBGARD 90 MG PO CPCR: ORAL_CAPSULE | ORAL | 0 refills | Status: DC

## 2022-05-20 NOTE — Progress Notes (Signed)
HPI :  22 year old female here for a follow-up visit.  I last saw her in June 2023.  Recall she has had problems with chronic abdominal pain, altered bowel habits and nausea.  She also has a history of depression.  She has had extensive workup with CT scans, EGD and colonoscopy done last July.  In short no concerning pathology noted to be causing her abdominal pain and bowel habits.  Incidentally she was noted to have a large/advanced sessile serrated polyp removed from her right colon last July, and piecemeal.  I recommended a repeat colonoscopy in roughly 1 year.  She is also had testing negative for celiac disease, thyroid, H. pylori testing etc.  I previously had placed her on omeprazole for reflux and upper abdominal discomfort, that is worked really well for her.  She has been using Zofran for nausea which she uses as needed.  I previously gave her a trial of Elavil and Bentyl.  She did not tolerate Elavil due to emotional outbursts.  We had transitioned her to Cymbalta 30 mg daily and that has really helped with her emotions since she has been on it, now for the past 1+ year.  She tried to increase the dose in the past but states her emotions felt "numb" on it.  She had been maintained on 30 mg dose.  I have not seen her since last year.  Generally she states the Cymbalta has definitely helped her and she likes how it worked for her.  In fact she is been having worsening depression lately.  Has been on Vraylar as well with her primary care and psychiatrist.  She is also following with a therapist for depression.  She states she has a lot of life stressors bothering her lately, depression is worse.  She has self increased her Cymbalta to twice daily over the past week or so.  She thinks that is helping.  She states she saw her primary care in the past few months, and told she had an iron deficiency.  I reviewed those labs with her on her phone today.  She had an iron level of 26, iron sat of 5%.   TIBC 49.  She denies any blood in her stools.  She endorses heavy menstrual cycles which is thought to be the cause of this, she is seeing her gynecologist for this.  She feels this iron deficiency is causing fatigue.  She otherwise has questions about what else could be causing her fatigue.  She has never had testing for sleep apnea.  She is been on omeprazole 40 mg twice daily and generally works well to control her reflux symptoms.  She takes an additional Pepcid as needed for pyrosis but does not need to take that too frequently if she can avoid her food triggers.  She states Bentyl has helped with cramps but makes her sleepy, she does not take it too frequently.  She uses Zofran as needed for nausea which helps.  She is not vomiting.  She states her bowels can alternate back-and-forth.  More recently she has been having constipation, upwards of 1 bowel movement per week or so.  We discussed how to manage that.   She denies any marijuana use, no NSAID use.     Most recent imaging: CT scan abdomen / pelvis 07/26/20 -  IMPRESSION: No abnormality seen in the abdomen or pelvis.   Pelvic US 07/26/20: Normal   CT scan abdomen / pelvis 03/22/2016: IMPRESSION: 1. 3.7 cm  cystic-appearing mass within the right adnexa, presumably small benign ovarian cyst. Per consensus guidelines, recommend follow-up pelvic ultrasound in 6-12 weeks to ensure resolution. Recommend pelvic ultrasound sooner if symptoms worsen. 2. Remainder of the abdomen and pelvis CT is unremarkable, as detailed above   RUQ [place] 05/30/2013: No gallstones, normal liver   EGD 08/24/21: - Esophagogastric landmarks were identified: the Z-line was found at 37 cm, the gastroesophageal junction was found at 37 cm and the upper extent of the gastric folds was found at 37 cm from the incisors. Findings: - The exam of the esophagus was otherwise normal. - The entire examined stomach was normal. Biopsies were taken with a cold forceps for Helicobacter  pylori testing. - The examined duodenum was normal. Biopsies for histology were taken with a cold forceps for evaluation of celiac disease.   Colonoscopy 08/24/21: - The perianal and digital rectal examinations were normal. - The terminal ileum appeared normal. - A 20 to 25 mm polyp was found in the ascending colon. The polyp was sessile, grossly concerning for sessile serrated polyp. The polyp was removed with a piecemeal technique using a cold snare. Resection and retrieval were complete. Area just proximal to the lesion was tattooed with an injection of Spot (carbon black). - Internal hemorrhoids were found during retroflexion. The hemorrhoids were small. - The exam was otherwise without abnormality. - Biopsies for histology were taken with a cold forceps from the right colon, left colon and transverse colon for evaluation of microscopic colitis.   1. Surgical [P], duodenal biopsy - DUODENAL MUCOSA WITHIN NORMAL LIMITS. 2. Surgical [P], gastric antrum and gastric body - ANTRAL MUCOSA WITH FEATURES OF BOTH MILD CHEMICAL/REACTIVE CHANGE AND MILD CHRONIC INACTIVE GASTRITIS. - OXYNTIC MUCOSA WITH NO SIGNIFICANT PATHOLOGY. - NO HELICOBACTER PYLORI ORGANISMS IDENTIFIED ON H&E STAINED SLIDE. 3. Surgical [P], colon nos, random sites - COLONIC MUCOSA WITHIN NORMAL LIMITS. 4. Surgical [P], colon, ascending, polyp (1) - SESSILE SERRATED POLYP WITHOUT DYSPLASIA  Celiac and H pylori serologies negative   Past Medical History:  Diagnosis Date   Anxiety    Bipolar depression    Borderline personality disorder    Depression    GERD (gastroesophageal reflux disease)    Hypertension    IBS (irritable bowel syndrome)    PCOS (polycystic ovarian syndrome)    Post traumatic stress disorder (PTSD)      Past Surgical History:  Procedure Laterality Date   None     Family History  Problem Relation Age of Onset   Depression Father    Irritable bowel syndrome Father    Mental illness Mother     Cancer Mother        ovary   Endometriosis Mother    COPD Maternal Grandmother    Cancer Maternal Grandmother        ovary   Colon cancer Neg Hx    Social History   Tobacco Use   Smoking status: Never   Smokeless tobacco: Never  Vaping Use   Vaping Use: Never used  Substance Use Topics   Alcohol use: Yes    Comment: once monthly   Drug use: Not Currently    Frequency: 1.0 times per week    Types: Marijuana   Current Outpatient Medications  Medication Sig Dispense Refill   cariprazine (VRAYLAR) 1.5 MG capsule      Cholecalciferol (VITAMIN D3) 1.25 MG (50000 UT) CAPS Take 1 capsule by mouth once a week.     dicyclomine (BENTYL) 20 MG tablet  TAKE 1 TABLET BY MOUTH EVERY 6 HOURS AS NEEDED FOR SPASMS. 60 tablet 2   DULoxetine (CYMBALTA) 30 MG capsule TAKE (1) CAPSULE BY MOUTH DAILY; ALTERNATING WITH 2 CAPSULES EVERY OTHER DAY. 45 capsule 0   LORazepam (ATIVAN) 1 MG tablet Take 1 mg by mouth 2 (two) times daily.     omeprazole (PRILOSEC) 40 MG capsule Take 1 capsule (40 mg total) by mouth 2 (two) times daily. 60 capsule 0   ondansetron (ZOFRAN) 4 MG tablet Take 1 tablet (4 mg total) by mouth every 8 (eight) hours as needed. 30 tablet 2   promethazine (PHENERGAN) 25 MG tablet Take 1 tablet (25 mg total) by mouth every 6 (six) hours as needed for up to 10 doses for nausea or vomiting. 10 tablet 0   Simethicone (GAS-X PO) Take 1 tablet by mouth daily.     No current facility-administered medications for this visit.   Allergies  Allergen Reactions   Other     Pt cannot remember which medicine makes her itch     Review of Systems: All systems reviewed and negative except where noted in HPI.   Lab Results  Component Value Date   WBC 9.8 01/23/2021   HGB 14.0 01/23/2021   HCT 41.5 01/23/2021   MCV 83.0 01/23/2021   PLT 231 01/23/2021    Lab Results  Component Value Date   CREATININE 0.56 01/23/2021   BUN 9 01/23/2021   NA 138 01/23/2021   K 3.4 (L) 01/23/2021   CL 107  01/23/2021   CO2 22 01/23/2021    Lab Results  Component Value Date   ALT 21 01/23/2021   AST 22 01/23/2021   ALKPHOS 49 01/23/2021   BILITOT 0.3 01/23/2021     Physical Exam: BP 122/78   Pulse 98   Ht  (1.6 m)   Wt 208 lb (94.3 kg)   BMI 36.85 kg/m  Constitutional: Pleasant,well-developed, female in no acute distress. Neurological: Alert and oriented to person place and time. Psychiatric: Normal mood and affect. Behavior is normal.   ASSESSMENT: 22 y.o. female here for assessment of the following  1. Gastroesophageal reflux disease, unspecified whether esophagitis present   2. Altered bowel habits   3. Chronic nausea   4. Depression, unspecified depression type   5. History of colon polyps    I think the patient likely has GERD with a functional bowel disorder causing altered bowel habits, bowel spasm.  More recently having some constipation.  Generally she is doing much better than when I previously saw her.  She thinks Cymbalta has helped quite a bit.  She is transitioning to a new psychiatrist, now on low-dose of Vraylar and inquires about increasing dose of her Cymbalta.  I discussed with her that there is potential risk for serotonin syndrome with high-dose Cymbalta and higher doses of Vraylar, I recommend she consult her primary care and psychiatrist before taking Cymbalta twice daily dosing at 30 mg.  She is agreeable to that.  If they are comfortable with her doing that, hopefully that will help not only her depression but her bowel symptoms as well.  She thinks higher dose omeprazole has definitely benefited her reflux symptoms.  She wishes to continue this.  I discussed long-term risks of chronic PPI with her, she will continue this, using lowest dose needed to control symptoms.    Given she is having some constipation recently, recommend holding Bentyl, gave her some samples of IBgard to use as needed  for cramps.  Recommend MiraLAX daily and titrate as needed  for constipation.  Finally I reviewed her prior colonoscopy, advance sessile serrated lesion.  Recommend repeat colonoscopy this summer for surveillance, given size and fashion which it was removed.  She is agreeable.  Further recommendations pending results.   PLAN: - continue omeprazole 40mg  BID - discussed long term risks, it is helping her, she wishes to continue - hold bentyl due to constipation - Miralax daily and titrate as needed - samples of IB gard to use PRN for abdominal cramps - Zofran PRN nausea - think depression may be driving or making baseline symptoms worse. Before she continue Cymbalta at twice daily needs to talk with PCP or psychiatry about interaction with Vraylar, as above. She should follow up with psychiatry for long term management of this and her therapist - colonoscopy in August for surveillance of polyps  Harlin Rain, MD [facility]

## 2022-05-20 NOTE — Patient Instructions (Addendum)
You have been scheduled for a colonoscopy on Monday, August 26th. You will need to arrive at 2:00pm that day.  We have scheduled you for a telephone nurse Pre visit on Monday 8-12 at 10:30am.   We have given you samples of the following medication to take:  Miralax: Take once daily IBGard:  Take as directed  We have sent the following medications to your pharmacy for you to pick up at your convenience: Omeprazole 40 mg: Twice a day  Hold Bentyl.  Thank you for entrusting me with your care and for choosing [facility], Dr. Ileene Patrick  If your blood pressure at your visit was 140/90 or greater, please contact your primary care physician to follow up on this. ______________________________________________________  If you are age 33 or older, your body mass index should be between 23-30. Your Body mass index is 36.85 kg/m. If this is out of the aforementioned range listed, please consider follow up with your Primary Care Provider.  If you are age 42 or younger, your body mass index should be between 19-25. Your Body mass index is 36.85 kg/m. If this is out of the aformentioned range listed, please consider follow up with your Primary Care Provider.  ________________________________________________________  The [place] providers would like to encourage you to use [facility] to communicate with providers for non-urgent requests or questions.  Due to long hold times on the telephone, sending your provider a message by [facility] may be a faster and more efficient way to get a response.  Please allow 48 business hours for a response.  Please remember that this is for non-urgent requests.  _______________________________________________________  Due to recent changes in healthcare laws, you may see the results of your imaging and laboratory studies on MyChart before your provider has had a chance to review them.  We understand that in some cases there may be results that are confusing  or concerning to you. Not all laboratory results come back in the same time frame and the provider may be waiting for multiple results in order to interpret others.  Please give [place] 48 hours in order for your provider to thoroughly review all the results before contacting the office for clarification of your results.

## 2022-05-26 ENCOUNTER — Other Ambulatory Visit: Payer: Self-pay | Admitting: Gastroenterology

## 2022-05-27 MED ORDER — DULOXETINE HCL 30 MG PO CPEP: 30.0000 mg | ORAL_CAPSULE | Freq: Every day | ORAL | 3 refills | Status: DC

## 2022-06-10 ENCOUNTER — Other Ambulatory Visit: Payer: Medicaid Other

## 2022-06-10 ENCOUNTER — Encounter: Payer: Medicaid Other | Admitting: Genetic Counselor

## 2022-09-02 ENCOUNTER — Other Ambulatory Visit (HOSPITAL_COMMUNITY): Payer: Self-pay

## 2022-09-06 ENCOUNTER — Telehealth: Payer: Self-pay

## 2022-09-06 NOTE — Telephone Encounter (Signed)
*  Gastro  Pharmacy Patient Advocate Encounter   Received notification from CoverMyMeds that prior authorization for Omeprazole 40MG  dr capsules  is required/requested.   Insurance verification completed.   The patient is insured through [facility] .   Per test claim: PA required; PA submitted to [facility] via CoverMyMeds Key/confirmation #/EOC [id number] Status is pending

## 2022-09-13 NOTE — Telephone Encounter (Signed)
Pharmacy Patient Advocate Encounter  Received notification from [facility] that Prior Authorization for Omeprazole has been APPROVED from 09/08/2022 to 09/08/2023

## 2022-09-20 ENCOUNTER — Encounter: Payer: No Typology Code available for payment source | Admitting: Gastroenterology

## 2022-09-21 ENCOUNTER — Inpatient Hospital Stay: Payer: No Typology Code available for payment source | Attending: Oncology | Admitting: Oncology

## 2022-09-21 ENCOUNTER — Encounter: Payer: Self-pay | Admitting: Oncology

## 2022-09-21 ENCOUNTER — Inpatient Hospital Stay: Payer: No Typology Code available for payment source

## 2022-09-21 VITALS — BP 140/79 | HR 98 | Temp 97.8°F | Resp 18 | Wt 215.2 lb

## 2022-09-21 DIAGNOSIS — N920 Excessive and frequent menstruation with regular cycle: Secondary | ICD-10-CM

## 2022-09-21 DIAGNOSIS — R5382 Chronic fatigue, unspecified: Secondary | ICD-10-CM

## 2022-09-21 DIAGNOSIS — D509 Iron deficiency anemia, unspecified: Secondary | ICD-10-CM | POA: Diagnosis not present

## 2022-09-21 DIAGNOSIS — D500 Iron deficiency anemia secondary to blood loss (chronic): Secondary | ICD-10-CM

## 2022-09-21 DIAGNOSIS — R5383 Other fatigue: Secondary | ICD-10-CM | POA: Insufficient documentation

## 2022-09-21 DIAGNOSIS — I10 Essential (primary) hypertension: Secondary | ICD-10-CM | POA: Diagnosis not present

## 2022-09-21 DIAGNOSIS — K2100 Gastro-esophageal reflux disease with esophagitis, without bleeding: Secondary | ICD-10-CM

## 2022-09-21 NOTE — Assessment & Plan Note (Signed)
Hemoglobin is mildly decreased at 11.8, not proportionate for her fatigue level Fatigue maybe due to other etiologies.

## 2022-09-21 NOTE — Assessment & Plan Note (Signed)
Continue PPI.  Follow-up with GI. 

## 2022-09-21 NOTE — Assessment & Plan Note (Signed)
Labs done at [facility] office are reviewed and discussed with patient. - scanned to EMR Results are consistent with iron deficiency anemia. Suspect due to chronic blood loss from heavy period.  She can not tolerate oral iron supplementation.  Alternative option is IV Venofer. I discussed about the potential risks of Venofer  including but not limited to allergic reactions/infusion reactions including anaphylactic reactions, phlebitis, high blood pressure, wheezing, SOB, skin rash, weight gain,dark urine, leg swelling, back pain, headache, nausea and fatigue, etc. Patient tolerates oral iron supplement poorly and agrees with IV Venofer treatments.  Plan IV venofer weekly x 4   She reports not sexually active currently. She declines pregnancy testing prior to Venofer treatments.

## 2022-09-21 NOTE — Progress Notes (Signed)
Hematology/Oncology Consult note [phone] [phone]      Patient Care Team: Gareth Morgan, MD as PCP - General (Family Medicine) Jena Gauss Gerrit Friends, MD as Consulting Physician (Gastroenterology)   REFERRING PROVIDER: Hyacinth Meeker, [place], [place]  CHIEF COMPLAINTS/REASON FOR VISIT:  Iron deficiency Anemia  ASSESSMENT & PLAN:  IDA (iron deficiency anemia) Labs done at [facility] office are reviewed and discussed with patient. - scanned to EMR Results are consistent with iron deficiency anemia. Suspect due to chronic blood loss from heavy period.  She can not tolerate oral iron supplementation.  Alternative option is IV Venofer. I discussed about the potential risks of Venofer  including but not limited to allergic reactions/infusion reactions including anaphylactic reactions, phlebitis, high blood pressure, wheezing, SOB, skin rash, weight gain,dark urine, leg swelling, back pain, headache, nausea and fatigue, etc. Patient tolerates oral iron supplement poorly and agrees with IV Venofer treatments.  Plan IV venofer weekly x 4   She reports not sexually active currently. She declines pregnancy testing prior to Venofer treatments.    GERD (gastroesophageal reflux disease) Continue PPI. Follow up with GI  Fatigue Hemoglobin is mildly decreased at 11.8, not proportionate for her fatigue level Fatigue maybe due to other etiologies.   Orders Placed This Encounter  Procedures   CBC with Differential (Cancer Center Only)    Standing Status:   Future    Standing Expiration Date:   09/21/2023   Iron and TIBC    Standing Status:   Future    Standing Expiration Date:   09/21/2023   Ferritin    Standing Status:   Future    Standing Expiration Date:   09/21/2023   Retic Panel    Standing Status:   Future    Standing Expiration Date:   09/21/2023   Follow up in 3 months. All questions were answered. The patient knows to call the clinic with any problems, questions or  concerns.  Rickard Patience, MD, PhD [facility] Hematology Oncology 09/21/2022     HISTORY OF PRESENTING ILLNESS:  Cynthia Ross is a  22 y.o.  female with PMH listed below who was referred to me for anemia Reviewed patient's recent labs that was done.  05/14/22 iron 26, transferrin 350, Iron saturation 5, TIBC 489  09/13/22 Hb 11.8, mcv 75.4 09/14/22 iron 42, transferrin 356, iron saturation 8, TIBC 498, B12 389  ferritin 9.2  + severe fatigue.   She denies recent chest pain on exertion, shortness of breath, pre-syncopal episodes, or palpitations She had not noticed any recent bleeding such as epistaxis, hematuria or hematochezia. Menstrual periods used to be very heavy. She has PCOS. Recently she has noticed periods are not as heavy as previously.  She occasionally takes over the counter NSAID ingestion. She is not on antiplatelets agents. Her last EGD and colonoscopy was 2023 Chronic epigastric discomfort, IBS symptoms.  She can not tolerate oral iron supplementation.   MEDICAL HISTORY:  Past Medical History:  Diagnosis Date   Anxiety    Bipolar depression (HCC)    Borderline personality disorder (HCC)    Depression    GERD (gastroesophageal reflux disease)    Hypertension    IBS (irritable bowel syndrome)    PCOS (polycystic ovarian syndrome)    Post traumatic stress disorder (PTSD)     SURGICAL HISTORY: Past Surgical History:  Procedure Laterality Date   None      SOCIAL HISTORY: Social History   Socioeconomic History   Marital status: Single  Spouse name: Not on file   Number of children: Not on file   Years of education: Not on file   Highest education level: Not on file  Occupational History   Not on file  Tobacco Use   Smoking status: Never   Smokeless tobacco: Never  Vaping Use   Vaping status: Never Used  Substance and Sexual Activity   Alcohol use: Yes    Comment: once monthly   Drug use: Not Currently    Frequency: 1.0 times per week    Types:  Marijuana   Sexual activity: Not Currently    Birth control/protection: None  Other Topics Concern   Not on file  Social History Narrative   Not on file   Social Determinants of Health   Financial Resource Strain: Low Risk  (09/21/2022)   Overall Financial Resource Strain (CARDIA)    Difficulty of Paying Living Expenses: Not very hard  Food Insecurity: No Food Insecurity (09/21/2022)   Hunger Vital Sign    Worried About Running Out of Food in the Last Year: Never true    Ran Out of Food in the Last Year: Never true  Transportation Needs: No Transportation Needs (09/21/2022)   PRAPARE - Administrator, Civil Service (Medical): No    Lack of Transportation (Non-Medical): No  Physical Activity: Not on file  Stress: No Stress Concern Present (09/21/2022)   [employer] of Occupational Health - Occupational Stress Questionnaire    Feeling of Stress : Only a little  Social Connections: Not on file  Intimate Partner Violence: Not At Risk (09/21/2022)   Humiliation, Afraid, Rape, and Kick questionnaire    Fear of Current or Ex-Partner: No    Emotionally Abused: No    Physically Abused: No    Sexually Abused: No    FAMILY HISTORY: Family History  Problem Relation Age of Onset   Depression Father    Irritable bowel syndrome Father    Mental illness Mother    Cancer Mother        ovary   Endometriosis Mother    COPD Maternal Grandmother    Cancer Maternal Grandmother        ovary   Colon cancer Neg Hx     ALLERGIES:  is allergic to other.  MEDICATIONS:  Current Outpatient Medications  Medication Sig Dispense Refill   DULoxetine (CYMBALTA) 30 MG capsule Take 1 capsule (30 mg total) by mouth daily. 30 capsule 3   LORazepam (ATIVAN) 1 MG tablet Take 1 mg by mouth 2 (two) times daily.     omeprazole (PRILOSEC) 40 MG capsule Take 1 capsule (40 mg total) by mouth 2 (two) times daily. 180 capsule 3   ondansetron (ZOFRAN) 4 MG tablet Take 1 tablet (4 mg total) by  mouth every 8 (eight) hours as needed. 30 tablet 2   promethazine (PHENERGAN) 25 MG tablet Take 1 tablet (25 mg total) by mouth every 6 (six) hours as needed for up to 10 doses for nausea or vomiting. 10 tablet 0   Cholecalciferol (VITAMIN D3) 1.25 MG (50000 UT) CAPS Take 1 capsule by mouth once a week. (Patient not taking: Reported on 09/21/2022)     dicyclomine (BENTYL) 20 MG tablet TAKE 1 TABLET BY MOUTH EVERY 6 HOURS AS NEEDED FOR SPASMS. (Patient not taking: Reported on 05/20/2022) 60 tablet 2   Peppermint Oil (IBGARD) 90 MG CPCR Take as directed (Patient not taking: Reported on 09/21/2022) 12 capsule 0   polyethylene glycol (MIRALAX) 17  g packet Take 17 g by mouth daily. (Patient not taking: Reported on 09/21/2022) 14 each 0   Simethicone (GAS-X PO) Take 1 tablet by mouth daily. (Patient not taking: Reported on 09/21/2022)     No current facility-administered medications for this visit.    Review of Systems  Constitutional:  Positive for fatigue. Negative for appetite change, chills and fever.  HENT:   Negative for hearing loss and voice change.   Eyes:  Negative for eye problems.  Respiratory:  Negative for chest tightness and cough.   Cardiovascular:  Negative for chest pain.  Gastrointestinal:  Negative for abdominal distention, abdominal pain and blood in stool.  Endocrine: Negative for hot flashes.  Genitourinary:  Positive for menstrual problem. Negative for difficulty urinating and frequency.   Musculoskeletal:  Negative for arthralgias.  Skin:  Negative for itching and rash.  Neurological:  Negative for extremity weakness.  Hematological:  Negative for adenopathy.  Psychiatric/Behavioral:  Negative for confusion.     PHYSICAL EXAMINATION: Vitals:   09/21/22 0930  BP: (!) 140/79  Pulse: 98  Resp: 18  Temp: 97.8 F (36.6 C)  SpO2: 98%   Filed Weights   09/21/22 0930  Weight: 215 lb 3.2 oz (97.6 kg)    Physical Exam Constitutional:      General: She is not in acute  distress. HENT:     Head: Normocephalic and atraumatic.  Eyes:     General: No scleral icterus. Cardiovascular:     Rate and Rhythm: Normal rate and regular rhythm.     Heart sounds: Normal heart sounds.  Pulmonary:     Effort: Pulmonary effort is normal. No respiratory distress.     Breath sounds: No wheezing.  Abdominal:     General: Bowel sounds are normal. There is no distension.     Palpations: Abdomen is soft.     Comments: Mild epigastric tenderness  Musculoskeletal:        General: No deformity. Normal range of motion.     Cervical back: Normal range of motion and neck supple.  Skin:    General: Skin is warm and dry.     Findings: No erythema or rash.  Neurological:     Mental Status: She is alert and oriented to person, place, and time. Mental status is at baseline.     Cranial Nerves: No cranial nerve deficit.     Coordination: Coordination normal.  Psychiatric:        Mood and Affect: Mood normal.      LABORATORY DATA:  I have reviewed the data as listed    Latest Ref Rng & Units 01/23/2021    9:32 AM 07/26/2020    7:21 AM 06/20/2020   12:23 PM  CBC  WBC 4.0 - 10.5 K/uL 9.8  16.3  12.6   Hemoglobin 12.0 - 15.0 g/dL 82.9  56.2  13.0   Hematocrit 36.0 - 46.0 % 41.5  41.8  41.4   Platelets 150 - 400 K/uL 231  248  230       Latest Ref Rng & Units 01/23/2021    9:32 AM 07/26/2020    7:21 AM 06/20/2020   12:23 PM  CMP  Glucose 70 - 99 mg/dL 99  865  88   BUN 6 - 20 mg/dL 9  8  8    Creatinine 0.44 - 1.00 mg/dL 7.84  6.96  2.95   Sodium 135 - 145 mmol/L 138  138  136   Potassium 3.5 - 5.1  mmol/L 3.4  3.7  3.7   Chloride 98 - 111 mmol/L 107  108  106   CO2 22 - 32 mmol/L 22  21  24    Calcium 8.9 - 10.3 mg/dL 8.9  9.3  9.1   Total Protein 6.5 - 8.1 g/dL 7.7  7.8  8.0   Total Bilirubin 0.3 - 1.2 mg/dL 0.3  0.6  0.5   Alkaline Phos 38 - 126 U/L 49  46  44   AST 15 - 41 U/L 22  21  19    ALT 0 - 44 U/L 21  18  17         RADIOGRAPHIC STUDIES: I have  personally reviewed the radiological images as listed and agreed with the findings in the report. No results found.

## 2022-09-24 ENCOUNTER — Inpatient Hospital Stay: Payer: No Typology Code available for payment source

## 2022-09-24 VITALS — BP 135/76 | HR 90 | Temp 97.5°F | Resp 16

## 2022-09-24 DIAGNOSIS — D509 Iron deficiency anemia, unspecified: Secondary | ICD-10-CM | POA: Diagnosis not present

## 2022-09-24 DIAGNOSIS — D500 Iron deficiency anemia secondary to blood loss (chronic): Secondary | ICD-10-CM

## 2022-09-24 MED ORDER — SODIUM CHLORIDE 0.9 % IV SOLN
200.0000 mg | Freq: Once | INTRAVENOUS | Status: AC
  Administered 2022-09-24: 200 mg via INTRAVENOUS
  Filled 2022-09-24: qty 200

## 2022-09-24 MED ORDER — SODIUM CHLORIDE 0.9 % IV SOLN
Freq: Once | INTRAVENOUS | Status: AC
  Filled 2022-09-24: qty 250

## 2022-09-30 ENCOUNTER — Other Ambulatory Visit: Payer: Self-pay

## 2022-09-30 MED ORDER — DULOXETINE HCL 30 MG PO CPEP: 30.0000 mg | ORAL_CAPSULE | Freq: Every day | ORAL | 3 refills | Status: DC

## 2022-10-01 ENCOUNTER — Inpatient Hospital Stay: Payer: No Typology Code available for payment source

## 2022-10-01 ENCOUNTER — Inpatient Hospital Stay: Payer: No Typology Code available for payment source | Attending: Oncology

## 2022-10-01 VITALS — BP 124/73 | HR 96 | Temp 98.0°F | Resp 19

## 2022-10-01 DIAGNOSIS — D500 Iron deficiency anemia secondary to blood loss (chronic): Secondary | ICD-10-CM

## 2022-10-01 DIAGNOSIS — D509 Iron deficiency anemia, unspecified: Secondary | ICD-10-CM | POA: Diagnosis present

## 2022-10-01 DIAGNOSIS — N920 Excessive and frequent menstruation with regular cycle: Secondary | ICD-10-CM | POA: Diagnosis not present

## 2022-10-01 MED ORDER — SODIUM CHLORIDE 0.9 % IV SOLN
Freq: Once | INTRAVENOUS | Status: AC
  Filled 2022-10-01: qty 250

## 2022-10-01 MED ORDER — SODIUM CHLORIDE 0.9 % IV SOLN
200.0000 mg | Freq: Once | INTRAVENOUS | Status: AC
  Administered 2022-10-01: 200 mg via INTRAVENOUS
  Filled 2022-10-01: qty 200

## 2022-10-08 ENCOUNTER — Inpatient Hospital Stay: Payer: No Typology Code available for payment source

## 2022-10-08 VITALS — BP 133/74 | HR 91 | Temp 96.8°F | Resp 18

## 2022-10-08 DIAGNOSIS — D500 Iron deficiency anemia secondary to blood loss (chronic): Secondary | ICD-10-CM

## 2022-10-08 DIAGNOSIS — D509 Iron deficiency anemia, unspecified: Secondary | ICD-10-CM | POA: Diagnosis not present

## 2022-10-08 MED ORDER — SODIUM CHLORIDE 0.9 % IV SOLN
200.0000 mg | Freq: Once | INTRAVENOUS | Status: AC
  Administered 2022-10-08: 200 mg via INTRAVENOUS
  Filled 2022-10-08: qty 200

## 2022-10-08 MED ORDER — SODIUM CHLORIDE 0.9 % IV SOLN
Freq: Once | INTRAVENOUS | Status: AC
  Filled 2022-10-08: qty 250

## 2022-10-08 NOTE — Progress Notes (Signed)
Patient declined to wait the 30 minutes for post iron infusion observation today.  Post iron education done. Patient verbalized understanding.

## 2022-10-15 ENCOUNTER — Inpatient Hospital Stay: Payer: No Typology Code available for payment source

## 2022-10-22 ENCOUNTER — Inpatient Hospital Stay: Payer: No Typology Code available for payment source

## 2022-10-22 VITALS — BP 132/75 | HR 75 | Temp 97.9°F | Resp 18

## 2022-10-22 DIAGNOSIS — D500 Iron deficiency anemia secondary to blood loss (chronic): Secondary | ICD-10-CM

## 2022-10-22 DIAGNOSIS — D509 Iron deficiency anemia, unspecified: Secondary | ICD-10-CM | POA: Diagnosis not present

## 2022-10-22 MED ORDER — SODIUM CHLORIDE 0.9 % IV SOLN
200.0000 mg | Freq: Once | INTRAVENOUS | Status: AC
  Administered 2022-10-22: 200 mg via INTRAVENOUS
  Filled 2022-10-22: qty 200

## 2022-10-22 MED ORDER — SODIUM CHLORIDE 0.9 % IV SOLN
Freq: Once | INTRAVENOUS | Status: AC
  Filled 2022-10-22: qty 250

## 2022-10-22 NOTE — Progress Notes (Signed)
Pt has been educated and understands. Pt declined to stay 30 mins after iron infusion. VSS.

## 2022-11-02 ENCOUNTER — Other Ambulatory Visit: Payer: Self-pay

## 2022-11-02 MED ORDER — OMEPRAZOLE 40 MG PO CPDR
40.0000 mg | DELAYED_RELEASE_CAPSULE | Freq: Two times a day (BID) | ORAL | 0 refills | Status: DC
  Filled 2023-04-26: qty 180, 90d supply, fill #0

## 2022-12-22 ENCOUNTER — Inpatient Hospital Stay: Payer: No Typology Code available for payment source | Attending: Oncology

## 2022-12-22 DIAGNOSIS — D509 Iron deficiency anemia, unspecified: Secondary | ICD-10-CM | POA: Insufficient documentation

## 2022-12-22 DIAGNOSIS — N920 Excessive and frequent menstruation with regular cycle: Secondary | ICD-10-CM | POA: Diagnosis not present

## 2022-12-22 DIAGNOSIS — D500 Iron deficiency anemia secondary to blood loss (chronic): Secondary | ICD-10-CM

## 2022-12-22 LAB — RETIC PANEL
Immature Retic Fract: 9.2 % (ref 2.3–15.9)
RBC.: 5.12 MIL/uL — ABNORMAL HIGH (ref 3.87–5.11)
Retic Count, Absolute: 83.5 10*3/uL (ref 19.0–186.0)
Retic Ct Pct: 1.6 % (ref 0.4–3.1)
Reticulocyte Hemoglobin: 30.8 pg (ref >27.9)

## 2022-12-22 LAB — CBC WITH DIFFERENTIAL (CANCER CENTER ONLY)
Abs Immature Granulocytes: 0.06 10*3/uL (ref 0.00–0.07)
Basophils Absolute: 0 10*3/uL (ref 0.0–0.1)
Basophils Relative: 1 %
Eosinophils Absolute: 0.2 10*3/uL (ref 0.0–0.5)
Eosinophils Relative: 3 %
HCT: 40.3 % (ref 36.0–46.0)
Hemoglobin: 13.6 g/dL (ref 12.0–15.0)
Immature Granulocytes: 1 %
Lymphocytes Relative: 29 %
Lymphs Abs: 1.5 10*3/uL (ref 0.7–4.0)
MCH: 26.7 pg (ref 26.0–34.0)
MCHC: 33.7 g/dL (ref 30.0–36.0)
MCV: 79.2 fL — ABNORMAL LOW (ref 80.0–100.0)
Monocytes Absolute: 0.4 10*3/uL (ref 0.1–1.0)
Monocytes Relative: 7 %
Neutro Abs: 3.1 10*3/uL (ref 1.7–7.7)
Neutrophils Relative %: 59 %
Platelet Count: 244 10*3/uL (ref 150–400)
RBC: 5.09 MIL/uL (ref 3.87–5.11)
RDW: 14.3 % (ref 11.5–15.5)
WBC Count: 5.3 10*3/uL (ref 4.0–10.5)
nRBC: 0 % (ref 0.0–0.2)

## 2022-12-22 LAB — IRON AND TIBC
Iron: 58 ug/dL (ref 28–170)
Saturation Ratios: 14 % (ref 10.4–31.8)
TIBC: 410 ug/dL (ref 250–450)
UIBC: 352 ug/dL

## 2022-12-22 LAB — FERRITIN: Ferritin: 45 ng/mL (ref 11–307)

## 2022-12-28 ENCOUNTER — Inpatient Hospital Stay: Payer: No Typology Code available for payment source

## 2022-12-28 ENCOUNTER — Inpatient Hospital Stay: Payer: No Typology Code available for payment source | Admitting: Oncology

## 2023-02-01 ENCOUNTER — Other Ambulatory Visit: Payer: Self-pay

## 2023-02-01 MED ORDER — BUPROPION HCL ER (XL) 150 MG PO TB24
150.0000 mg | ORAL_TABLET | Freq: Every day | ORAL | 0 refills | Status: DC
  Filled 2023-02-01 – 2023-02-11 (×2): qty 60, 60d supply, fill #0

## 2023-02-01 MED ORDER — BUSPIRONE HCL 15 MG PO TABS
ORAL_TABLET | ORAL | 0 refills | Status: DC
  Filled 2023-02-01 – 2023-02-11 (×2): qty 180, 60d supply, fill #0

## 2023-02-02 ENCOUNTER — Other Ambulatory Visit: Payer: Self-pay

## 2023-02-02 MED ORDER — LORAZEPAM 1 MG PO TABS
1.0000 mg | ORAL_TABLET | Freq: Two times a day (BID) | ORAL | 0 refills | Status: DC
  Filled 2023-02-02 – 2023-02-11 (×2): qty 60, 30d supply, fill #0

## 2023-02-10 ENCOUNTER — Other Ambulatory Visit: Payer: Self-pay

## 2023-02-11 ENCOUNTER — Other Ambulatory Visit: Payer: Self-pay

## 2023-03-01 ENCOUNTER — Other Ambulatory Visit: Payer: Self-pay

## 2023-03-02 ENCOUNTER — Other Ambulatory Visit: Payer: Self-pay

## 2023-03-02 MED ORDER — METFORMIN HCL ER 500 MG PO TB24
500.0000 mg | ORAL_TABLET | Freq: Every day | ORAL | 0 refills | Status: DC
  Filled 2023-03-02: qty 30, 30d supply, fill #0

## 2023-03-03 ENCOUNTER — Other Ambulatory Visit: Payer: Self-pay

## 2023-03-04 ENCOUNTER — Other Ambulatory Visit: Payer: Self-pay

## 2023-03-04 MED ORDER — BUPROPION HCL ER (XL) 300 MG PO TB24
300.0000 mg | ORAL_TABLET | Freq: Every day | ORAL | 0 refills | Status: DC
  Filled 2023-03-04: qty 60, 60d supply, fill #0

## 2023-03-15 ENCOUNTER — Other Ambulatory Visit: Payer: Self-pay

## 2023-03-15 MED ORDER — LORAZEPAM 1 MG PO TABS
1.0000 mg | ORAL_TABLET | Freq: Two times a day (BID) | ORAL | 0 refills | Status: DC
  Filled 2023-03-15: qty 60, 30d supply, fill #0

## 2023-03-15 MED ORDER — BUPROPION HCL ER (XL) 300 MG PO TB24
300.0000 mg | ORAL_TABLET | Freq: Every day | ORAL | 0 refills | Status: DC
  Filled 2023-03-15: qty 90, 90d supply, fill #0

## 2023-03-15 MED ORDER — BUSPIRONE HCL 15 MG PO TABS
ORAL_TABLET | ORAL | 0 refills | Status: DC
  Filled 2023-04-25: qty 180, 60d supply, fill #0

## 2023-03-16 ENCOUNTER — Other Ambulatory Visit: Payer: Self-pay

## 2023-03-16 MED ORDER — PHENTERMINE HCL 37.5 MG PO TABS
18.7500 mg | ORAL_TABLET | Freq: Every day | ORAL | 0 refills | Status: DC
  Filled 2023-03-16: qty 15, 30d supply, fill #0

## 2023-03-28 ENCOUNTER — Other Ambulatory Visit: Payer: Self-pay

## 2023-03-28 ENCOUNTER — Encounter: Payer: Self-pay | Admitting: Oncology

## 2023-03-28 MED ORDER — BUPROPION HCL ER (XL) 450 MG PO TB24
450.0000 mg | ORAL_TABLET | Freq: Every day | ORAL | 0 refills | Status: DC
Start: 2023-03-28 — End: 2023-06-13
  Filled 2023-03-28 – 2023-03-30 (×3): qty 30, 30d supply, fill #0

## 2023-03-29 ENCOUNTER — Encounter: Payer: Self-pay | Admitting: Oncology

## 2023-03-29 ENCOUNTER — Other Ambulatory Visit: Payer: Self-pay

## 2023-03-29 MED ORDER — BUPROPION HCL ER (XL) 300 MG PO TB24
300.0000 mg | ORAL_TABLET | Freq: Every day | ORAL | 0 refills | Status: DC
  Filled 2023-03-30: qty 30, 30d supply, fill #0

## 2023-03-29 MED ORDER — BUPROPION HCL ER (XL) 150 MG PO TB24
150.0000 mg | ORAL_TABLET | Freq: Every day | ORAL | 0 refills | Status: DC
  Filled 2023-03-30 (×2): qty 30, 30d supply, fill #0

## 2023-03-30 ENCOUNTER — Other Ambulatory Visit: Payer: Self-pay

## 2023-03-30 MED ORDER — CLINDAMYCIN PHOSPHATE 1 % EX SOLN
Freq: Two times a day (BID) | CUTANEOUS | 2 refills | Status: DC
  Filled 2023-03-30: qty 60, 30d supply, fill #0

## 2023-03-30 MED ORDER — ONDANSETRON HCL 4 MG PO TABS
4.0000 mg | ORAL_TABLET | Freq: Three times a day (TID) | ORAL | 3 refills | Status: DC | PRN
  Filled 2023-03-30: qty 30, 10d supply, fill #0

## 2023-04-14 ENCOUNTER — Other Ambulatory Visit: Payer: Self-pay

## 2023-04-14 MED ORDER — BUPROPION HCL ER (XL) 300 MG PO TB24: 300.0000 mg | ORAL_TABLET | Freq: Every day | ORAL | 0 refills | Status: DC

## 2023-04-14 MED ORDER — LAMOTRIGINE 25 MG PO TABS
ORAL_TABLET | ORAL | 0 refills | Status: DC
  Filled 2023-04-14: qty 57, 30d supply, fill #0

## 2023-04-14 MED ORDER — LORAZEPAM 1 MG PO TABS
1.0000 mg | ORAL_TABLET | Freq: Two times a day (BID) | ORAL | 0 refills | Status: DC
  Filled 2023-04-14: qty 60, 30d supply, fill #0

## 2023-04-19 ENCOUNTER — Other Ambulatory Visit: Payer: Self-pay

## 2023-04-19 MED ORDER — PHENTERMINE HCL 37.5 MG PO TABS
37.5000 mg | ORAL_TABLET | Freq: Every day | ORAL | 0 refills | Status: DC
Start: 2023-04-19 — End: 2023-05-02
  Filled 2023-04-19: qty 15, 15d supply, fill #0

## 2023-04-25 ENCOUNTER — Other Ambulatory Visit: Payer: Self-pay

## 2023-04-26 ENCOUNTER — Other Ambulatory Visit: Payer: Self-pay

## 2023-04-26 ENCOUNTER — Other Ambulatory Visit: Payer: Self-pay | Admitting: Gastroenterology

## 2023-05-02 ENCOUNTER — Other Ambulatory Visit: Payer: Self-pay

## 2023-05-02 MED ORDER — PHENTERMINE HCL 37.5 MG PO TABS
37.5000 mg | ORAL_TABLET | Freq: Every day | ORAL | 0 refills | Status: DC
  Filled 2023-05-02: qty 30, 30d supply, fill #0

## 2023-05-06 ENCOUNTER — Other Ambulatory Visit: Payer: Self-pay

## 2023-05-06 MED ORDER — CLINDAMYCIN PHOSPHATE 1 % EX SOLN
1.0000 | Freq: Two times a day (BID) | CUTANEOUS | 2 refills | Status: AC
  Filled 2023-05-06 – 2023-10-24 (×2): qty 60, 30d supply, fill #0
  Filled 2024-03-01: qty 60, 30d supply, fill #1

## 2023-05-06 MED ORDER — SPIRONOLACTONE 50 MG PO TABS
50.0000 mg | ORAL_TABLET | Freq: Every day | ORAL | 2 refills | Status: DC
Start: 2023-05-06 — End: 2023-07-05
  Filled 2023-05-06: qty 30, 30d supply, fill #0

## 2023-05-09 ENCOUNTER — Other Ambulatory Visit: Payer: Self-pay

## 2023-05-09 MED ORDER — LAMOTRIGINE 25 MG PO TABS
50.0000 mg | ORAL_TABLET | Freq: Every day | ORAL | 0 refills | Status: DC
  Filled 2023-05-10: qty 60, 30d supply, fill #0

## 2023-05-09 MED ORDER — BUPROPION HCL ER (XL) 300 MG PO TB24
300.0000 mg | ORAL_TABLET | Freq: Every day | ORAL | 0 refills | Status: DC
  Filled 2023-05-09 – 2023-05-27 (×2): qty 90, 90d supply, fill #0

## 2023-05-09 MED ORDER — LORAZEPAM 1 MG PO TABS: 1.0000 mg | ORAL_TABLET | Freq: Two times a day (BID) | ORAL | 0 refills | Status: DC

## 2023-05-10 ENCOUNTER — Other Ambulatory Visit: Payer: Self-pay

## 2023-05-11 ENCOUNTER — Other Ambulatory Visit: Payer: Self-pay

## 2023-05-12 ENCOUNTER — Other Ambulatory Visit: Payer: Self-pay

## 2023-05-18 ENCOUNTER — Other Ambulatory Visit: Payer: Self-pay

## 2023-05-18 MED ORDER — LAMOTRIGINE 25 MG PO TABS
75.0000 mg | ORAL_TABLET | Freq: Every day | ORAL | 0 refills | Status: DC
  Filled 2023-05-18 – 2023-05-31 (×3): qty 90, 30d supply, fill #0

## 2023-05-27 ENCOUNTER — Other Ambulatory Visit: Payer: Self-pay

## 2023-05-30 ENCOUNTER — Other Ambulatory Visit: Payer: Self-pay

## 2023-05-31 ENCOUNTER — Other Ambulatory Visit: Payer: Self-pay

## 2023-06-02 ENCOUNTER — Other Ambulatory Visit: Payer: Self-pay

## 2023-06-03 ENCOUNTER — Other Ambulatory Visit: Payer: Self-pay

## 2023-06-09 ENCOUNTER — Encounter: Payer: Self-pay | Admitting: Oncology

## 2023-06-09 ENCOUNTER — Other Ambulatory Visit: Payer: Self-pay

## 2023-06-09 MED ORDER — LORAZEPAM 1 MG PO TABS
1.0000 mg | ORAL_TABLET | Freq: Two times a day (BID) | ORAL | 0 refills | Status: DC
Start: 2023-06-08 — End: 2023-09-20
  Filled 2023-06-09: qty 60, 30d supply, fill #0

## 2023-06-09 MED ORDER — LAMOTRIGINE 25 MG PO TABS
50.0000 mg | ORAL_TABLET | Freq: Two times a day (BID) | ORAL | 0 refills | Status: DC
  Filled 2023-06-26: qty 120, 30d supply, fill #0

## 2023-06-09 MED ORDER — BUPROPION HCL ER (XL) 450 MG PO TB24
450.0000 mg | ORAL_TABLET | Freq: Every day | ORAL | 0 refills | Status: DC
Start: 2023-06-08 — End: 2023-06-13
  Filled 2023-06-09: qty 30, 30d supply, fill #0
  Filled 2023-06-13: qty 90, 90d supply, fill #0

## 2023-06-13 ENCOUNTER — Other Ambulatory Visit: Payer: Self-pay

## 2023-06-13 MED ORDER — BUPROPION HCL ER (XL) 300 MG PO TB24
300.0000 mg | ORAL_TABLET | Freq: Every day | ORAL | 0 refills | Status: DC
  Filled 2023-08-25: qty 90, 90d supply, fill #0

## 2023-06-13 MED ORDER — PHENTERMINE HCL 37.5 MG PO TABS
37.5000 mg | ORAL_TABLET | Freq: Every day | ORAL | 0 refills | Status: DC
  Filled 2023-06-13: qty 30, 30d supply, fill #0

## 2023-06-13 MED ORDER — BUPROPION HCL ER (XL) 150 MG PO TB24
150.0000 mg | ORAL_TABLET | Freq: Every day | ORAL | 0 refills | Status: DC
  Filled 2023-06-13: qty 90, 90d supply, fill #0

## 2023-06-14 ENCOUNTER — Other Ambulatory Visit: Payer: Self-pay

## 2023-06-14 ENCOUNTER — Other Ambulatory Visit: Payer: Self-pay | Admitting: Gastroenterology

## 2023-06-14 MED ORDER — BUPROPION HCL ER (XL) 150 MG PO TB24
150.0000 mg | ORAL_TABLET | Freq: Every morning | ORAL | 0 refills | Status: DC
  Filled 2023-06-14: qty 30, 30d supply, fill #0

## 2023-06-14 MED ORDER — BUPROPION HCL ER (XL) 300 MG PO TB24: 300.0000 mg | ORAL_TABLET | Freq: Every day | ORAL | 0 refills | Status: DC

## 2023-06-14 MED ORDER — ONDANSETRON HCL 4 MG PO TABS
4.0000 mg | ORAL_TABLET | Freq: Three times a day (TID) | ORAL | 0 refills | Status: DC | PRN
  Filled 2023-06-14: qty 30, 10d supply, fill #0

## 2023-06-23 ENCOUNTER — Other Ambulatory Visit: Payer: Self-pay

## 2023-06-26 ENCOUNTER — Other Ambulatory Visit: Payer: Self-pay

## 2023-06-27 ENCOUNTER — Other Ambulatory Visit: Payer: Self-pay

## 2023-06-27 MED ORDER — PHENTERMINE HCL 37.5 MG PO TABS
37.5000 mg | ORAL_TABLET | Freq: Every day | ORAL | 0 refills | Status: DC
  Filled 2023-07-26: qty 30, 30d supply, fill #0

## 2023-06-28 ENCOUNTER — Other Ambulatory Visit: Payer: Self-pay

## 2023-07-01 ENCOUNTER — Other Ambulatory Visit: Payer: Self-pay

## 2023-07-01 MED ORDER — DROSPIRENONE-ETHINYL ESTRADIOL 3-0.03 MG PO TABS
1.0000 | ORAL_TABLET | Freq: Every day | ORAL | 1 refills | Status: AC
  Filled 2023-07-01: qty 84, 84d supply, fill #0
  Filled 2023-09-05: qty 84, 84d supply, fill #1

## 2023-07-02 ENCOUNTER — Other Ambulatory Visit: Payer: Self-pay

## 2023-07-05 ENCOUNTER — Other Ambulatory Visit: Payer: Self-pay

## 2023-07-05 ENCOUNTER — Ambulatory Visit (INDEPENDENT_AMBULATORY_CARE_PROVIDER_SITE_OTHER): Admitting: Gastroenterology

## 2023-07-05 ENCOUNTER — Encounter: Payer: Self-pay | Admitting: Gastroenterology

## 2023-07-05 VITALS — BP 130/88 | HR 102 | Ht 64.0 in | Wt 183.4 lb

## 2023-07-05 DIAGNOSIS — R110 Nausea: Secondary | ICD-10-CM | POA: Diagnosis not present

## 2023-07-05 DIAGNOSIS — K219 Gastro-esophageal reflux disease without esophagitis: Secondary | ICD-10-CM

## 2023-07-05 DIAGNOSIS — Z860101 Personal history of adenomatous and serrated colon polyps: Secondary | ICD-10-CM

## 2023-07-05 DIAGNOSIS — K5900 Constipation, unspecified: Secondary | ICD-10-CM

## 2023-07-05 DIAGNOSIS — K5909 Other constipation: Secondary | ICD-10-CM

## 2023-07-05 DIAGNOSIS — Z860100 Personal history of colon polyps, unspecified: Secondary | ICD-10-CM

## 2023-07-05 DIAGNOSIS — R109 Unspecified abdominal pain: Secondary | ICD-10-CM

## 2023-07-05 MED ORDER — FAMOTIDINE 40 MG PO TABS
40.0000 mg | ORAL_TABLET | Freq: Every day | ORAL | 3 refills | Status: DC
  Filled 2023-07-05: qty 30, 30d supply, fill #0

## 2023-07-05 NOTE — Progress Notes (Signed)
 Agree with assessment and plan as outlined.  This patient is overdue for colonoscopy. She had an advanced polyp removed in piecemeal fashion at a rather young age and would strongly recommend that she have her follow up colonoscopy as soon as she is able to

## 2023-07-05 NOTE — Patient Instructions (Addendum)
 Start taking Miralax  1 capful (17 grams) 1x / day for 1 week.   If this is not effective, increase to 1 dose 2x / day for 1 week.   If this is still not effective, increase to two capfuls (34 grams) 2x / day.   Can adjust dose as needed based on response. Can take 1/2 cap daily, skip days, or increase per day.    We have sent the following medications to your pharmacy for you to pick up at your convenience:  Famotodine  _______________________________________________________  If your blood pressure at your visit was 140/90 or greater, please contact your primary care physician to follow up on this.  _______________________________________________________  If you are age 25 or older, your body mass index should be between 23-30. Your Body mass index is 31.48 kg/m. If this is out of the aforementioned range listed, please consider follow up with your Primary Care Provider.  If you are age 81 or younger, your body mass index should be between 19-25. Your Body mass index is 31.48 kg/m. If this is out of the aformentioned range listed, please consider follow up with your Primary Care Provider.   ________________________________________________________  The [place] [facility] providers would like to encourage you to use MYCHART to communicate with providers for non-urgent requests or questions.  Due to long hold times on the telephone, sending your provider a message by [facility] may be a faster and more efficient way to get a response.  Please allow 48 business hours for a response.  Please remember that this is for non-urgent requests.  _______________________________________________________

## 2023-07-05 NOTE — Progress Notes (Signed)
 Chief Complaint: IBS Primary GI MD: Dr. General Kenner  HPI: Discussed the use of AI scribe software for clinical note transcription with the patient, who gave verbal consent to proceed.  History of Present Illness Cynthia Ross is a 23 year old female with a history of precancerous colon polyp who presents with constipation and nausea.  Over the past six months, she has experienced a significant change in bowel habits, transitioning from diarrhea to constipation. Bowel movements occur once or twice a week and are often incomplete, requiring excessive wiping. Stools are described as 'sticky' rather than hard. Approximately a month ago, she was unable to have a bowel movement for two to three weeks without the use of laxatives or suppositories, which were ineffective. She has not been using any regular bowel medications recently but has purchased Miralax , which she has not yet started due to concerns about its effects at work. She is frustrated with her current condition, stating she is 'tired of living like this.'  She experiences persistent nausea, which has worsened over the past two weeks, described as a dull pain. She is currently taking omeprazole  twice daily for acid reflux, which has also worsened recently. She uses Zofran  for nausea, which she finds effective but acknowledges it exacerbates her constipation. She reports feeling 'shaky' and experiencing increased fatigue over the past couple of weeks.  She denies any recent changes in stress levels and mentions being on multiple medications. She works at [employer], where she has been employed for two years, and expresses difficulty taking time off work due to limited PTO. 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    PREVIOUS GI WORKUP   Most recent imaging: CT scan abdomen / pelvis 07/26/20 -  IMPRESSION: No abnormality seen in the abdomen or pelvis.   Pelvic US   07/26/20: Normal   CT scan abdomen / pelvis 03/22/2016: IMPRESSION: 1. 3.7 cm cystic-appearing mass within the right adnexa, presumably small benign ovarian cyst. Per consensus guidelines, recommend follow-up pelvic ultrasound in 6-12 weeks to ensure resolution. Recommend pelvic ultrasound sooner if symptoms worsen. 2. Remainder of the abdomen and pelvis CT is unremarkable, as detailed above   RUQ US  05/30/2013: No gallstones, normal liver   EGD 08/24/21: - Esophagogastric landmarks were identified: the Z-line was found at 37 cm, the gastroesophageal junction was found at 37 cm and the upper extent of the gastric folds was found at 37 cm from the incisors. Findings: - The exam of the esophagus was otherwise normal. - The entire examined stomach was normal. Biopsies were taken with a cold forceps for Helicobacter pylori testing. - The examined duodenum was normal. Biopsies for histology were taken with a cold forceps for evaluation of celiac disease.     Colonoscopy 08/24/21: - The perianal and digital rectal examinations were normal. - The terminal ileum appeared normal. - A 20 to 25 mm polyp was found in the ascending colon. The polyp was sessile, grossly concerning for sessile serrated polyp. The polyp was removed with a piecemeal technique using a cold snare. Resection and retrieval were complete. Area just proximal to the lesion was tattooed with an injection of Spot (carbon black). - Internal hemorrhoids were found during retroflexion. The hemorrhoids were small. - The exam was otherwise without abnormality. - Biopsies for histology were taken with a cold forceps from the right colon, left colon and transverse colon for evaluation of microscopic colitis.  1. Surgical [P], duodenal biopsy - DUODENAL MUCOSA WITHIN NORMAL LIMITS. 2. Surgical [P], gastric antrum and gastric body - ANTRAL MUCOSA WITH FEATURES OF BOTH MILD CHEMICAL/REACTIVE CHANGE AND MILD CHRONIC INACTIVE GASTRITIS. - OXYNTIC  MUCOSA WITH NO SIGNIFICANT PATHOLOGY. - NO HELICOBACTER PYLORI ORGANISMS IDENTIFIED ON H&E STAINED SLIDE. 3. Surgical [P], colon nos, random sites - COLONIC MUCOSA WITHIN NORMAL LIMITS. 4. Surgical [P], colon, ascending, polyp (1) - SESSILE SERRATED POLYP WITHOUT DYSPLASIA   Celiac and H pylori serologies negative   Past Medical History:  Diagnosis Date   Anemia    Anxiety    Bipolar depression (HCC)    Borderline personality disorder (HCC)    Depression    GERD (gastroesophageal reflux disease)    Hypertension    IBS (irritable bowel syndrome)    PCOS (polycystic ovarian syndrome)    Post traumatic stress disorder (PTSD)     Past Surgical History:  Procedure Laterality Date   None      Current Outpatient Medications  Medication Sig Dispense Refill   buPROPion  (WELLBUTRIN  XL) 150 MG 24 hr tablet Take 1 tablet (150 mg total) by mouth daily.Take with 300mg . 90 tablet 0   buPROPion  (WELLBUTRIN  XL) 150 MG 24 hr tablet Take 1 tablet (150 mg total) by mouth every morning. 30 tablet 0   buPROPion  (WELLBUTRIN  XL) 300 MG 24 hr tablet Take 1 tablet (300 mg total) by mouth daily.Take with 150mg . 90 tablet 0   buPROPion  (WELLBUTRIN  XL) 300 MG 24 hr tablet Take 1 tablet (300 mg total) by mouth daily. 90 tablet 0   busPIRone  (BUSPAR ) 15 MG tablet Take 1 tablet (15 mg total) by mouth in the morning AND 2 tablets (30 mg total) Nightly. 180 tablet 0   clindamycin  (CLEOCIN  T) 1 % external solution Apply 1 Application topically 2 (two) times daily. 60 mL 2   dicyclomine  (BENTYL ) 20 MG tablet TAKE 1 TABLET BY MOUTH EVERY 6 HOURS AS NEEDED FOR SPASMS. 60 tablet 2   drospirenone -ethinyl estradiol  (YASMIN ) 3-0.03 MG tablet Take 1 tablet by mouth daily. 84 tablet 1   famotidine (PEPCID) 40 MG tablet Take 1 tablet (40 mg total) by mouth daily. 30 tablet 3   lamoTRIgine  (LAMICTAL ) 25 MG tablet Take 2 tablets (50 mg total) by mouth 2 (two) times daily. 120 tablet 0   LORazepam  (ATIVAN ) 1 MG tablet Take  1 tablet (1 mg total) by mouth 2 (two) times daily. 60 tablet 0   LORazepam  (ATIVAN ) 1 MG tablet Take 1 tablet (1 mg total) by mouth 2 (two) times daily. 60 tablet 0   omeprazole  (PRILOSEC ) 40 MG capsule Take 1 capsule (40 mg total) by mouth 2 (two) times daily. 180 capsule 0   ondansetron  (ZOFRAN ) 4 MG tablet Take 1 tablet (4 mg total) by mouth every 8 (eight) hours as needed. 30 tablet 2   ondansetron  (ZOFRAN ) 4 MG tablet Take 1 tablet (4 mg total) by mouth 3 (three) times daily as needed. PLEASE KEEP 06/2023 APPOINTMENT 30 tablet 0   phentermine  (ADIPEX-P ) 37.5 MG tablet Take 1 tablet (37.5 mg total) by mouth daily before breakfast. 30 tablet 0   promethazine  (PHENERGAN ) 25 MG tablet Take 1 tablet (25 mg total) by mouth every 6 (six) hours as needed for up to 10 doses for nausea or vomiting. 10 tablet 0   No current facility-administered medications for this visit.    Allergies as of 07/05/2023 - Review Complete 07/05/2023  Allergen Reaction Noted   Other  08/17/2018    Family History  Problem Relation Age of Onset   Depression Father    Irritable bowel syndrome Father    Mental illness Mother    Cancer Mother        ovary   Endometriosis Mother    COPD Maternal Grandmother    Cancer Maternal Grandmother        ovary   Colon cancer Neg Hx     Social History   Socioeconomic History   Marital status: Single    Spouse name: Not on file   Number of children: Not on file   Years of education: Not on file   Highest education level: Not on file  Occupational History   Not on file  Tobacco Use   Smoking status: Never   Smokeless tobacco: Never  Vaping Use   Vaping status: Never Used  Substance and Sexual Activity   Alcohol use: Yes    Comment: once monthly   Drug use: Not Currently    Frequency: 1.0 times per week    Types: Marijuana   Sexual activity: Not Currently    Birth control/protection: None  Other Topics Concern   Not on file  Social History Narrative   Not  on file   Social Drivers of Health   Financial Resource Strain: Low Risk  (09/21/2022)   Overall Financial Resource Strain (CARDIA)    Difficulty of Paying Living Expenses: Not very hard  Food Insecurity: No Food Insecurity (09/21/2022)   Hunger Vital Sign    Worried About Running Out of Food in the Last Year: Never true    Ran Out of Food in the Last Year: Never true  Transportation Needs: No Transportation Needs (09/21/2022)   PRAPARE - Administrator, Civil Service (Medical): No    Lack of Transportation (Non-Medical): No  Physical Activity: Not on file  Stress: No Stress Concern Present (09/21/2022)   [employer] of Occupational Health - Occupational Stress Questionnaire    Feeling of Stress : Only a little  Social Connections: Not on file  Intimate Partner Violence: Not At Risk (09/21/2022)   Humiliation, Afraid, Rape, and Kick questionnaire    Fear of Current or Ex-Partner: No    Emotionally Abused: No    Physically Abused: No    Sexually Abused: No    Review of Systems:    Constitutional: No weight loss, fever, chills, weakness or fatigue HEENT: Eyes: No change in vision               Ears, Nose, Throat:  No change in hearing or congestion Skin: No rash or itching Cardiovascular: No chest pain, chest pressure or palpitations   Respiratory: No SOB or cough Gastrointestinal: See HPI and otherwise negative Genitourinary: No dysuria or change in urinary frequency Neurological: No headache, dizziness or syncope Musculoskeletal: No new muscle or joint pain Hematologic: No bleeding or bruising Psychiatric: No history of depression or anxiety    Physical Exam:  Vital signs: BP 130/88   Pulse (!) 102   Ht 5\' 4"  (1.626 m)   Wt 183 lb 6 oz (83.2 kg)   BMI 31.48 kg/m   Constitutional: NAD, alert and cooperative Head:  Normocephalic and atraumatic. Eyes:   PEERL, EOMI. No icterus. Conjunctiva pink. Respiratory: Respirations even and unlabored. Lungs clear  to auscultation bilaterally.   No wheezes, crackles, or rhonchi.  Cardiovascular:  Regular rate and rhythm. No peripheral edema, cyanosis or pallor.  Gastrointestinal:  Soft, nondistended, nontender.  No rebound or guarding. Normal bowel sounds. No appreciable masses or hepatomegaly. Rectal:  Declines Msk:  Symmetrical without gross deformities. Without edema, no deformity or joint abnormality.  Neurologic:  Alert and  oriented x4;  grossly normal neurologically.  Skin:   Dry and intact without significant lesions or rashes. Psychiatric: Oriented to person, place and time. Demonstrates good judgement and reason without abnormal affect or behaviors.   RELEVANT LABS AND IMAGING: CBC    Component Value Date/Time   WBC 5.3 12/22/2022 0825   WBC 9.8 01/23/2021 0932   RBC 5.12 (H) 12/22/2022 0825   RBC 5.09 12/22/2022 0825   HGB 13.6 12/22/2022 0825   HGB 13.7 01/11/2017 1034   HCT 40.3 12/22/2022 0825   HCT 40.8 01/11/2017 1034   PLT 244 12/22/2022 0825   PLT 255 01/11/2017 1034   MCV 79.2 (L) 12/22/2022 0825   MCV 81 01/11/2017 1034   MCH 26.7 12/22/2022 0825   MCHC 33.7 12/22/2022 0825   RDW 14.3 12/22/2022 0825   RDW 14.1 01/11/2017 1034   LYMPHSABS 1.5 12/22/2022 0825   MONOABS 0.4 12/22/2022 0825   EOSABS 0.2 12/22/2022 0825   BASOSABS 0.0 12/22/2022 0825    CMP     Component Value Date/Time   NA 138 01/23/2021 0932   NA 142 01/11/2017 1034   K 3.4 (L) 01/23/2021 0932   CL 107 01/23/2021 0932   CO2 22 01/23/2021 0932   GLUCOSE 99 01/23/2021 0932   BUN 9 01/23/2021 0932   BUN 8 01/11/2017 1034   CREATININE 0.56 01/23/2021 0932   CALCIUM 8.9 01/23/2021 0932   PROT 7.7 01/23/2021 0932   PROT 7.1 01/11/2017 1034   ALBUMIN 4.6 01/23/2021 0932   ALBUMIN 4.4 01/11/2017 1034   AST 22 01/23/2021 0932   ALT 21 01/23/2021 0932   ALKPHOS 49 01/23/2021 0932   BILITOT 0.3 01/23/2021 0932   BILITOT <0.2 01/11/2017 1034   GFRNONAA >60 01/23/2021 0932   GFRAA 148 01/11/2017  1034     Assessment/Plan:   Chronic abdominal pain Altered bowel habits Extensive negative workup including CT scans, EGD, colonoscopy. Negative Celiac. At last visit 2024 was improving on Cymbalta  and Vraylar. Now having Chronic constipation with infrequent, sticky, difficult bowel movements. - MiraLAX  1 capful a day and adjust dose based on response - Increase water, increase fiber, increase exercise - Squatty potty - Follow-up 12 weeks  GERD Nausea Longstanding history of GERD well-controlled on omeprazole  40 Mg twice daily now having pretty consistent nausea likely exacerbated by her constipation.  Takes Pepcid with mild relief. - Famotidine 40 Mg once daily - Educated patient on lifestyle modifications and provided patient education handouts - Optimize bowel regimen - If persistent symptoms could consider repeat EGD with colonoscopy at follow-up  History of colon polyps Colonoscopy July 2023 with large/advanced sessile serrated polyp removed from right colon and piecemeal with repeat due 08/2022 which was not pursued. Patient currently not able to schedule at this time due to limited PTO and would like to revisit this conversation at follow-up and may be plan for a November colonoscopy.  Advised with change in bowel habits recommendation for colonoscopy. - Will schedule follow-up   Cynthia Ross [facility] 07/05/2023, 9:38 AM  Cc: Darliss Ek, MD

## 2023-07-06 ENCOUNTER — Other Ambulatory Visit: Payer: Self-pay

## 2023-07-06 MED ORDER — BUSPIRONE HCL 15 MG PO TABS
ORAL_TABLET | ORAL | 0 refills | Status: DC
  Filled 2023-07-06: qty 180, 60d supply, fill #0

## 2023-07-08 ENCOUNTER — Other Ambulatory Visit: Payer: Self-pay

## 2023-07-13 ENCOUNTER — Telehealth: Payer: Self-pay | Admitting: Gastroenterology

## 2023-07-13 DIAGNOSIS — R194 Change in bowel habit: Secondary | ICD-10-CM

## 2023-07-13 NOTE — Telephone Encounter (Signed)
 Inbound call from patient stating she has been trying Miralax  but she is unable to take it everyday. States she is still having constipation and would like other recommendations to help. Please advise, thank you.

## 2023-07-13 NOTE — Telephone Encounter (Signed)
 Spoke to patient who complains that she has taken miralax  a few times over the last couple of weeks but has difficulty with is. States she took 1/2 packet last week and had multiple loose bowel movements. Took 3/4 packet this week and missed work due to multiple unformed bowel movements throughout the day. Patient says that her stools are very sticky and unformed. States it is difficult to get clean after a bowel movement and is starting to interfere with ADL. No blood noted, no pain.  Patient is advised that she should follow a high fiber diet and hold off on miralax  for right now, instead, purchase benefiber over the counter. Start at 1 teaspoon daily and work her way to 1 tablespoon daily over a 1-2 week period. Cautioned against initial gas/bloating that may take place with fiber supplementation. Patient is also advised that she should increase fluid intake to 64 oz or more daily and should exercise at least 20-30 minutes daily several times per week. Discussed using a toilet stool for ergonomics. Patient states she is using the toilet stool.  Additionally, patient is advised that Dr General Kenner recommends she go ahead and move forward with colonoscopy at this time (as she was due for 1 year recall 2024) due to her history of advanced polyp at a young age. Advised we need to make certain she has not developed additional polyps or other abnormalities that could be causing her bowel changes. Patient states she would definitely like to pursue repeat colonoscopy but will be unable to do so until October or November time frame. States she must have enough paid time off for work to use or will be fired. Patient is advised to reach back out to schedule procedure as soon as she is able and she verbalizes understanding of this.

## 2023-07-13 NOTE — Telephone Encounter (Signed)
 Left message for patient to call back.   Dr General Kenner has actually recommended that she have colonoscopy now as she has a history of advanced polyp at a young age (done 2023; recommended 1 year recall).  Patient should also be titrating miralax  as needed to achieve soft bowel movements, increasing water intake to 64 oz  daily, following a high fiber diet, increasing exercise. Should also try squatty potty/stool when using the restroom for help ergonomically.

## 2023-07-14 NOTE — Telephone Encounter (Signed)
 Patient is requesting a call back for an update  Please advise  Thank you

## 2023-07-15 ENCOUNTER — Other Ambulatory Visit: Payer: Self-pay

## 2023-07-15 MED ORDER — SUTAB 1479-225-188 MG PO TABS
ORAL_TABLET | ORAL | 0 refills | Status: DC
  Filled 2023-07-15: qty 24, 1d supply, fill #0
  Filled 2023-08-01: qty 24, 30d supply, fill #0

## 2023-07-15 NOTE — Telephone Encounter (Signed)
 Spoke to patient and offered colonoscopy 07/20/23. Patient states she needs a Monday procedure. Next available Monday procedure is 09/12/23. Patient has taken this date. Patient has been advised of time/date/location for upcoming procedure and has been given generalized verbal prep instructions. Discussed that a care partner 18 years or older should bring her, stay for the procedure and drive home due to sedation. Written instructions have been made available to the patient for additional review via mychart.  Of note: patient specifically requests SuTab  since she has difficulty with liquid prep. She is advised that she must still drink large amounts of fluid for suTab  to work appropriately. She verbalizes understanding.

## 2023-07-15 NOTE — Addendum Note (Signed)
 Addended by: Glennette Lanius on: 07/15/2023 10:52 AM   Modules accepted: Orders

## 2023-07-17 ENCOUNTER — Encounter: Payer: Self-pay | Admitting: Oncology

## 2023-07-17 ENCOUNTER — Other Ambulatory Visit: Payer: Self-pay

## 2023-07-17 ENCOUNTER — Emergency Department
Admission: EM | Admit: 2023-07-17 | Discharge: 2023-07-18 | Disposition: A | Discharge status: Home / Self Care | Attending: Emergency Medicine | Admitting: Emergency Medicine

## 2023-07-17 DIAGNOSIS — I10 Essential (primary) hypertension: Secondary | ICD-10-CM | POA: Diagnosis not present

## 2023-07-17 DIAGNOSIS — R110 Nausea: Secondary | ICD-10-CM | POA: Insufficient documentation

## 2023-07-17 DIAGNOSIS — R1084 Generalized abdominal pain: Secondary | ICD-10-CM | POA: Insufficient documentation

## 2023-07-17 DIAGNOSIS — R1010 Upper abdominal pain, unspecified: Secondary | ICD-10-CM | POA: Diagnosis present

## 2023-07-17 LAB — CBC
HCT: 44.6 % (ref 36.0–46.0)
Hemoglobin: 15.4 g/dL — ABNORMAL HIGH (ref 12.0–15.0)
MCH: 27.3 pg (ref 26.0–34.0)
MCHC: 34.5 g/dL (ref 30.0–36.0)
MCV: 78.9 fL — ABNORMAL LOW (ref 80.0–100.0)
Platelets: 277 10*3/uL (ref 150–400)
RBC: 5.65 MIL/uL — ABNORMAL HIGH (ref 3.87–5.11)
RDW: 12.4 % (ref 11.5–15.5)
WBC: 12.7 10*3/uL — ABNORMAL HIGH (ref 4.0–10.5)
nRBC: 0 % (ref 0.0–0.2)

## 2023-07-17 LAB — COMPREHENSIVE METABOLIC PANEL WITH GFR
ALT: 60 U/L — ABNORMAL HIGH (ref 0–44)
AST: 26 U/L (ref 15–41)
Albumin: 4.2 g/dL (ref 3.5–5.0)
Alkaline Phosphatase: 46 U/L (ref 38–126)
Anion gap: 9 (ref 5–15)
BUN: 12 mg/dL (ref 6–20)
CO2: 19 mmol/L — ABNORMAL LOW (ref 22–32)
Calcium: 9.4 mg/dL (ref 8.9–10.3)
Chloride: 107 mmol/L (ref 98–111)
Creatinine, Ser: 0.74 mg/dL (ref 0.44–1.00)
GFR, Estimated: >60 mL/min (ref >60)
Glucose, Bld: 124 mg/dL — ABNORMAL HIGH (ref 70–99)
Potassium: 3.7 mmol/L (ref 3.5–5.1)
Sodium: 135 mmol/L (ref 135–145)
Total Bilirubin: 0.7 mg/dL (ref 0.0–1.2)
Total Protein: 8.1 g/dL (ref 6.5–8.1)

## 2023-07-17 LAB — LIPASE, BLOOD: Lipase: 48 U/L (ref 11–51)

## 2023-07-17 MED ORDER — ONDANSETRON 4 MG PO TBDP
4.0000 mg | ORAL_TABLET | Freq: Once | ORAL | Status: AC | PRN
  Administered 2023-07-17: 4 mg via ORAL
  Filled 2023-07-17: qty 1

## 2023-07-17 MED ORDER — ONDANSETRON HCL 4 MG/2ML IJ SOLN
4.0000 mg | Freq: Once | INTRAMUSCULAR | Status: AC
  Administered 2023-07-18: 4 mg via INTRAVENOUS
  Filled 2023-07-17: qty 2

## 2023-07-17 MED ORDER — MORPHINE SULFATE (PF) 4 MG/ML IV SOLN
4.0000 mg | Freq: Once | INTRAVENOUS | Status: DC
  Filled 2023-07-17: qty 1

## 2023-07-17 MED ORDER — SODIUM CHLORIDE 0.9 % IV BOLUS (SEPSIS)
1000.0000 mL | Freq: Once | INTRAVENOUS | Status: AC
  Administered 2023-07-18: 1000 mL via INTRAVENOUS

## 2023-07-17 NOTE — ED Notes (Signed)
 ED Provider at bedside.

## 2023-07-17 NOTE — ED Triage Notes (Signed)
 Pt to ED via POV c/o upper abd pain that started around 5pm. Endorses some nausea. Took zofran  2 hrs ago and promethazine  around 6pm. Hx of IBS and PCOS

## 2023-07-17 NOTE — ED Provider Notes (Signed)
 [facility] Provider Note    Event Date/Time   First MD Initiated Contact with Patient 07/17/23 2345     (approximate)   History   Abdominal Pain   HPI  Cynthia Ross is a 23 y.o. female with history of IBS, PCOS, depression, PTSD, GERD who presents to the emergency department with generalized abdominal pain worse in the upper abdomen, severe nausea without vomiting.  No hematuria, vaginal bleeding or discharge.  No known sick contacts or recent travel.   History provided by patient, family.    Past Medical History:  Diagnosis Date   Anemia    Anxiety    Bipolar depression (HCC)    Borderline personality disorder (HCC)    Depression    GERD (gastroesophageal reflux disease)    Hypertension    IBS (irritable bowel syndrome)    PCOS (polycystic ovarian syndrome)    Post traumatic stress disorder (PTSD)     Past Surgical History:  Procedure Laterality Date   None      MEDICATIONS:  Prior to Admission medications   Medication Sig Start Date End Date Taking? Authorizing Provider  buPROPion  (WELLBUTRIN  XL) 150 MG 24 hr tablet Take 1 tablet (150 mg total) by mouth daily.Take with 300mg . 06/13/23     buPROPion  (WELLBUTRIN  XL) 150 MG 24 hr tablet Take 1 tablet (150 mg total) by mouth every morning. 06/14/23     buPROPion  (WELLBUTRIN  XL) 300 MG 24 hr tablet Take 1 tablet (300 mg total) by mouth daily.Take with 150mg . 06/13/23     buPROPion  (WELLBUTRIN  XL) 300 MG 24 hr tablet Take 1 tablet (300 mg total) by mouth daily. 06/14/23     busPIRone  (BUSPAR ) 15 MG tablet Take 1 tablet (15 mg total) by mouth in the morning AND 2 tablets (30 mg total) at bedtime. 07/06/23     clindamycin  (CLEOCIN  T) 1 % external solution Apply 1 Application topically 2 (two) times daily. 05/06/23     dicyclomine  (BENTYL ) 20 MG tablet TAKE 1 TABLET BY MOUTH EVERY 6 HOURS AS NEEDED FOR SPASMS. 12/23/21   Armbruster, Elspeth SQUIBB, MD  drospirenone -ethinyl estradiol  (YASMIN ) 3-0.03 MG  tablet Take 1 tablet by mouth daily. 07/01/23     famotidine  (PEPCID ) 40 MG tablet Take 1 tablet (40 mg total) by mouth daily. 07/05/23   McMichael, Nestor CHRISTELLA, PA-C  lamoTRIgine  (LAMICTAL ) 25 MG tablet Take 2 tablets (50 mg total) by mouth 2 (two) times daily. 06/08/23     LORazepam  (ATIVAN ) 1 MG tablet Take 1 tablet (1 mg total) by mouth 2 (two) times daily. 05/09/23     LORazepam  (ATIVAN ) 1 MG tablet Take 1 tablet (1 mg total) by mouth 2 (two) times daily. 06/08/23     omeprazole  (PRILOSEC ) 40 MG capsule Take 1 capsule (40 mg total) by mouth 2 (two) times daily. 11/02/22   Armbruster, Elspeth SQUIBB, MD  ondansetron  (ZOFRAN ) 4 MG tablet Take 1 tablet (4 mg total) by mouth every 8 (eight) hours as needed. 07/24/21   Armbruster, Elspeth SQUIBB, MD  ondansetron  (ZOFRAN ) 4 MG tablet Take 1 tablet (4 mg total) by mouth 3 (three) times daily as needed. PLEASE KEEP 06/2023 APPOINTMENT 06/14/23   Armbruster, Elspeth SQUIBB, MD  phentermine  (ADIPEX-P ) 37.5 MG tablet Take 1 tablet (37.5 mg total) by mouth daily before breakfast. 06/27/23     promethazine  (PHENERGAN ) 25 MG tablet Take 1 tablet (25 mg total) by mouth every 6 (six) hours as needed for up to 10 doses for  nausea or vomiting. 07/26/20   Cottie Donnice PARAS, MD  Sodium Sulfate-Mag Sulfate-KCl (SUTAB ) [phone] MG TABS Use as directed for colonoscopy. 07/15/23   Armbruster, Elspeth SQUIBB, MD  hyoscyamine  (LEVSIN , ANASPAZ ) 0.125 MG tablet Take 1 tablet (0.125 mg total) by mouth every 6 (six) hours as needed. Patient not taking: Reported on 12/16/2017 12/14/16 08/17/18  Signa Delon LABOR, NP    Physical Exam   Triage Vital Signs: ED Triage Vitals  Encounter Vitals Group     BP 07/17/23 2244 119/72     Girls Systolic BP Percentile --      Girls Diastolic BP Percentile --      Boys Systolic BP Percentile --      Boys Diastolic BP Percentile --      Pulse Rate 07/17/23 2244 (!) 106     Resp 07/17/23 2244 18     Temp 07/17/23 2244 97.9 F (36.6 C)     Temp Source 07/17/23 2244  Oral     SpO2 07/17/23 2244 98 %     Weight 07/17/23 2242 185 lb (83.9 kg)     Height 07/17/23 2242 5' 4 (1.626 m)     Head Circumference --      Peak Flow --      Pain Score 07/17/23 2242 8     Pain Loc --      Pain Education --      Exclude from Growth Chart --     Most recent vital signs: Vitals:   07/18/23 0400 07/18/23 0435  BP: 117/65 118/68  Pulse: (!) 104 84  Resp: 18 18  Temp:  99.2 F (37.3 C)  SpO2: 97% 99%    CONSTITUTIONAL: Alert, responds appropriately to questions. Well-appearing; well-nourished HEAD: Normocephalic, atraumatic EYES: Conjunctivae clear, pupils appear equal, sclera nonicteric ENT: normal nose; moist mucous membranes NECK: Supple, normal ROM CARD: RRR; S1 and S2 appreciated RESP: Normal chest excursion without splinting or tachypnea; breath sounds clear and equal bilaterally; no wheezes, no rhonchi, no rales, no hypoxia or respiratory distress, speaking full sentences ABD/GI: Non-distended; soft, diffusely tender throughout the mid and upper abdomen with increased pain in the right upper quadrant with intermittent guarding, no rebound, no significant tenderness at McBurney's point BACK: The back appears normal EXT: Normal ROM in all joints; no deformity noted, no edema SKIN: Normal color for age and race; warm; no rash on exposed skin NEURO: Moves all extremities equally, normal speech PSYCH: The patient's mood and manner are appropriate.   ED Results / Procedures / Treatments   LABS: (all labs ordered are listed, but only abnormal results are displayed) Labs Reviewed  COMPREHENSIVE METABOLIC PANEL WITH GFR - Abnormal; Notable for the following components:      Result Value   CO2 19 (*)    Glucose, Bld 124 (*)    ALT 60 (*)    All other components within normal limits  CBC - Abnormal; Notable for the following components:   WBC 12.7 (*)    RBC 5.65 (*)    Hemoglobin 15.4 (*)    MCV 78.9 (*)    All other components within normal  limits  URINALYSIS, ROUTINE W REFLEX MICROSCOPIC - Abnormal; Notable for the following components:   Color, Urine AMBER (*)    APPearance CLOUDY (*)    Ketones, ur 5 (*)    Protein, ur 30 (*)    Leukocytes,Ua TRACE (*)    Bacteria, UA RARE (*)    All other components within  normal limits  URINE DRUG SCREEN, QUALITATIVE ([facility] ONLY) - Abnormal; Notable for the following components:   Benzodiazepine, Ur Scrn POSITIVE (*)    All other components within normal limits  URINE CULTURE  LIPASE, BLOOD  POC URINE PREG, ED     EKG:  RADIOLOGY: My personal review and interpretation of imaging: CT of abdomen pelvis unremarkable.  Right upper quadrant ultrasound normal.  I have personally reviewed all radiology reports.   CT ABDOMEN PELVIS W CONTRAST Result Date: 07/18/2023 CLINICAL DATA:  Abdominal pain EXAM: CT ABDOMEN AND PELVIS WITH CONTRAST TECHNIQUE: Multidetector CT imaging of the abdomen and pelvis was performed using the standard protocol following bolus administration of intravenous contrast. RADIATION DOSE REDUCTION: This exam was performed according to the departmental dose-optimization program which includes automated exposure control, adjustment of the mA and/or kV according to patient size and/or use of iterative reconstruction technique. CONTRAST:  OMNIPAQUE  IOHEXOL  300 MG/ML  SOLN COMPARISON:  07/26/2020 FINDINGS: Lower Chest: Normal. Hepatobiliary: Normal hepatic contours. No intra- or extrahepatic biliary dilatation. The gallbladder is normal. Pancreas: Normal pancreas. No ductal dilatation or peripancreatic fluid collection. Spleen: Normal. Adrenals/Urinary Tract: The adrenal glands are normal. No hydronephrosis, nephroureterolithiasis or solid renal mass. The urinary bladder is normal for degree of distention Stomach/Bowel: There is no hiatal hernia. Normal duodenal course and caliber. No small bowel dilatation or inflammation. No focal colonic abnormality. Normal appendix.  Vascular/Lymphatic: Normal course and caliber of the major abdominal vessels. No abdominal or pelvic lymphadenopathy. Reproductive: Normal uterus. No adnexal mass. Other: None. Musculoskeletal: No bony spinal canal stenosis or focal osseous abnormality. IMPRESSION: No acute abnormality of the abdomen or pelvis. Electronically Signed   By: Franky Stanford M.D.   On: 07/18/2023 03:18   US  ABDOMEN LIMITED RUQ (LIVER/GB) Result Date: 07/18/2023 CLINICAL DATA:  Right upper quadrant pain EXAM: ULTRASOUND ABDOMEN LIMITED RIGHT UPPER QUADRANT COMPARISON:  07/26/2020 FINDINGS: Gallbladder: No gallstones or wall thickening visualized. No sonographic Murphy sign noted by sonographer. Common bile duct: Diameter: 3 mm Liver: No focal lesion identified. Within normal limits in parenchymal echogenicity. Portal vein is patent on color Doppler imaging with normal direction of blood flow towards the liver. Other: None. IMPRESSION: No acute abnormality noted. Electronically Signed   By: Oneil Devonshire M.D.   On: 07/18/2023 00:30     PROCEDURES:  Critical Care performed: No    Procedures    IMPRESSION / MDM / ASSESSMENT AND PLAN / ED COURSE  I reviewed the triage vital signs and the nursing notes.    Patient here with upper abdominal pain, severe nausea.  The patient is on the cardiac monitor to evaluate for evidence of arrhythmia and/or significant heart rate changes.   DIFFERENTIAL DIAGNOSIS (includes but not limited to):   Cholelithiasis, cholecystitis, gastritis, GERD, viral gastroenteritis, colitis, pancreatitis, peptic ulcer, H. pylori, less likely appendicitis, UTI, diverticulitis, bowel obstruction   Patient's presentation is most consistent with acute presentation with potential threat to life or bodily function.   PLAN: Labs obtained from triage show leukocytosis of 12,000.  ALT slightly elevated but otherwise normal LFTs, lipase.  Will obtain urinalysis, urine pregnancy and right upper quadrant  ultrasound.  Will give IV fluids, pain and nausea medicine.   MEDICATIONS GIVEN IN ED: Medications  ondansetron  (ZOFRAN -ODT) disintegrating tablet 4 mg (4 mg Oral Given 07/17/23 2352)  sodium chloride  0.9 % bolus 1,000 mL (0 mLs Intravenous Stopped 07/18/23 0131)  ondansetron  (ZOFRAN ) injection 4 mg (4 mg Intravenous Given 07/18/23 0039)  morphine (  PF) 2 MG/ML injection 2 mg (2 mg Intravenous Given 07/18/23 0057)  morphine (PF) 2 MG/ML injection 2 mg (2 mg Intravenous Given 07/18/23 0151)  dicyclomine  (BENTYL ) capsule 20 mg (20 mg Oral Given 07/18/23 0151)  promethazine  (PHENERGAN ) 25 mg in sodium chloride  0.9 % 50 mL IVPB (0 mg Intravenous Stopped 07/18/23 0214)  metoCLOPramide  (REGLAN ) injection 10 mg (10 mg Intravenous Given 07/18/23 0325)  sodium chloride  0.9 % bolus 1,000 mL (0 mLs Intravenous Stopped 07/18/23 0450)  ketorolac (TORADOL) 30 MG/ML injection 30 mg (30 mg Intravenous Given 07/18/23 0325)  iohexol  (OMNIPAQUE ) 300 MG/ML solution 100 mL (100 mLs Intravenous Contrast Given 07/18/23 0253)  acetaminophen  (TYLENOL ) tablet 1,000 mg (1,000 mg Oral Given 07/18/23 0458)     ED COURSE: Urine does not appear infected today but rather a contaminated sample.  She is not having any urinary symptoms.  Right upper quadrant ultrasound reviewed and interpreted by myself and the radiologist and is unremarkable.  On review of her records it appears she has been seen at multiple EDs and by gastroenterology for similar symptoms.  She has had multiple reassuring workups.  She feels like this is an exacerbation of her chronic abdominal pain.  States she has had some improvement with morphine, Zofran .  Will give additional medication here, p.o. challenge.  2:46 AM  Pt now vomiting despite getting Zofran  x 2, Phenergan .  States pain only improved a little bit and is now coming back.  Will give additional medications including Toradol, Reglan  and more IV fluids.  I suspect that she may need admission to the hospital  for control of intractable symptoms.  Will obtain CT of her abdomen pelvis for further evaluation given patient is now vomiting and has a leukocytosis which is new for her.  We did discuss risk and benefits of radiation exposure.  Patient agrees to proceed.  5:00 AM  Pt's CT scan reviewed and interpreted by myself and the radiologist and is unremarkable.  She does not have a temperature of 99.6.  We discussed that this could be the beginnings of a viral gastroenteritis.  She reports she is feeling much better and is tolerating p.o.  She is comfortable plan for discharge.  Will discharge with prescriptions for Phenergan , Bentyl .  Recommended alternating Tylenol , Motrin .  Recommended bland diet.   At this time, I do not feel there is any life-threatening condition present. I reviewed all nursing notes, vitals, pertinent previous records.  All lab and urine results, EKGs, imaging ordered have been independently reviewed and interpreted by myself.  I reviewed all available radiology reports from any imaging ordered this visit.  Based on my assessment, I feel the patient is safe to be discharged home without further emergent workup and can continue workup as an outpatient as needed. Discussed all findings, treatment plan as well as usual and customary return precautions.  They verbalize understanding and are comfortable with this plan.  Outpatient follow-up has been provided as needed.  All questions have been answered.    CONSULTS:  none   OUTSIDE RECORDS REVIEWED: Reviewed last gastroenterology note on 07/05/2023 for chronic abdominal pain, GERD and nausea.       FINAL CLINICAL IMPRESSION(S) / ED DIAGNOSES   Final diagnoses:  Pain of upper abdomen  Nausea without vomiting     Rx / DC Orders   ED Discharge Orders          Ordered    dicyclomine  (BENTYL ) 20 MG tablet  Every 8 hours PRN  07/18/23 0429    promethazine  (PHENERGAN ) 25 MG tablet  Every 6 hours PRN        07/18/23 0429              Note:  This document was prepared using Dragon voice recognition software and may include unintentional dictation errors.   Jolana Runkles, Josette SAILOR, DO 07/18/23 [phone]

## 2023-07-18 ENCOUNTER — Emergency Department

## 2023-07-18 ENCOUNTER — Encounter: Payer: Self-pay | Admitting: Oncology

## 2023-07-18 LAB — URINE DRUG SCREEN, QUALITATIVE (ARMC ONLY)
Amphetamines, Ur Screen: NOT DETECTED
Barbiturates, Ur Screen: NOT DETECTED
Benzodiazepine, Ur Scrn: POSITIVE — AB
Cannabinoid 50 Ng, Ur ~~LOC~~: NOT DETECTED
Cocaine Metabolite,Ur ~~LOC~~: NOT DETECTED
MDMA (Ecstasy)Ur Screen: NOT DETECTED
Methadone Scn, Ur: NOT DETECTED
Opiate, Ur Screen: NOT DETECTED
Phencyclidine (PCP) Ur S: NOT DETECTED
Tricyclic, Ur Screen: NOT DETECTED

## 2023-07-18 LAB — URINALYSIS, ROUTINE W REFLEX MICROSCOPIC
Bilirubin Urine: NEGATIVE
Glucose, UA: NEGATIVE mg/dL
Hgb urine dipstick: NEGATIVE
Ketones, ur: 5 mg/dL — AB
Nitrite: NEGATIVE
Protein, ur: 30 mg/dL — AB
Specific Gravity, Urine: 1.03 (ref 1.005–1.030)
pH: 5 (ref 5.0–8.0)

## 2023-07-18 LAB — POC URINE PREG, ED: Preg Test, Ur: NEGATIVE

## 2023-07-18 MED ORDER — PROMETHAZINE HCL 25 MG PO TABS
25.0000 mg | ORAL_TABLET | Freq: Four times a day (QID) | ORAL | 0 refills | Status: AC | PRN
Start: 2023-07-18 — End: ?

## 2023-07-18 MED ORDER — SODIUM CHLORIDE 0.9 % IV BOLUS (SEPSIS)
1000.0000 mL | Freq: Once | INTRAVENOUS | Status: AC
  Administered 2023-07-18: 1000 mL via INTRAVENOUS

## 2023-07-18 MED ORDER — DICYCLOMINE HCL 20 MG PO TABS: 20.0000 mg | ORAL_TABLET | Freq: Three times a day (TID) | ORAL | 0 refills | Status: DC | PRN

## 2023-07-18 MED ORDER — DICYCLOMINE HCL 10 MG PO CAPS
20.0000 mg | ORAL_CAPSULE | Freq: Once | ORAL | Status: AC
  Administered 2023-07-18: 20 mg via ORAL
  Filled 2023-07-18: qty 2

## 2023-07-18 MED ORDER — ACETAMINOPHEN 500 MG PO TABS
1000.0000 mg | ORAL_TABLET | Freq: Once | ORAL | Status: AC
  Administered 2023-07-18: 1000 mg via ORAL
  Filled 2023-07-18: qty 2

## 2023-07-18 MED ORDER — IOHEXOL 300 MG/ML  SOLN
100.0000 mL | Freq: Once | INTRAMUSCULAR | Status: AC | PRN
  Administered 2023-07-18: 100 mL via INTRAVENOUS

## 2023-07-18 MED ORDER — SODIUM CHLORIDE 0.9 % IV SOLN
25.0000 mg | Freq: Once | INTRAVENOUS | Status: AC
  Administered 2023-07-18: 25 mg via INTRAVENOUS
  Filled 2023-07-18: qty 25

## 2023-07-18 MED ORDER — METOCLOPRAMIDE HCL 5 MG/ML IJ SOLN
10.0000 mg | Freq: Once | INTRAMUSCULAR | Status: AC
  Administered 2023-07-18: 10 mg via INTRAVENOUS
  Filled 2023-07-18: qty 2

## 2023-07-18 MED ORDER — MORPHINE SULFATE (PF) 2 MG/ML IV SOLN
2.0000 mg | Freq: Once | INTRAVENOUS | Status: AC
  Administered 2023-07-18: 2 mg via INTRAVENOUS

## 2023-07-18 MED ORDER — KETOROLAC TROMETHAMINE 30 MG/ML IJ SOLN
30.0000 mg | Freq: Once | INTRAMUSCULAR | Status: AC
  Administered 2023-07-18: 30 mg via INTRAVENOUS
  Filled 2023-07-18: qty 1

## 2023-07-18 MED ORDER — MORPHINE SULFATE (PF) 2 MG/ML IV SOLN
2.0000 mg | Freq: Once | INTRAVENOUS | Status: AC
  Administered 2023-07-18: 2 mg via INTRAVENOUS
  Filled 2023-07-18: qty 1

## 2023-07-18 NOTE — Discharge Instructions (Addendum)
 You may alternate over the counter Tylenol 1000 mg every 6 hours as needed for pain, fever and Ibuprofen 800 mg every 6-8 hours as needed for pain, fever.  Please take Ibuprofen with food.  Do not take more than 4000 mg of Tylenol (acetaminophen) in a 24 hour period.

## 2023-07-18 NOTE — ED Notes (Signed)
 Patient transported to CT

## 2023-07-19 LAB — URINE CULTURE: Culture: >=100000 — AB

## 2023-07-20 ENCOUNTER — Telehealth: Payer: Self-pay | Admitting: Gastroenterology

## 2023-07-20 NOTE — Consult Note (Signed)
 Patient grew lactobacillus in urine culture. Pt was not sent home on any antibiotics. Discussed case with Dr. Levander, who agrees, this is most likely a contaminant and does not need treatment. UA was positive for 11-20 squamous epithelial cells.    Thanks,  Cathaleen Blanch, PharmD, BCPS

## 2023-07-20 NOTE — Telephone Encounter (Signed)
 Inbound call from patient stating she was at the ED on 6/22 for upper abdominal pain and nausea. States she is still feeling very bad and unsure how to proceed. States she has not been able to work or eat. Requesting a call back to discuss further. Please advise, thank you

## 2023-07-22 ENCOUNTER — Telehealth: Payer: Self-pay | Admitting: Gastroenterology

## 2023-07-22 NOTE — Telephone Encounter (Signed)
 I have spoken to patient who actually has already scheduled a follow up appointment with Ellouise Console, PA-C 07/25/23. I have advised that she keep this appointment to discuss her concerns.   Of note: she is already scheduled for colonoscopy 09/12/23 with Dr Leigh as per his recommendation for history of advanced colon polyp at a young age.

## 2023-07-22 NOTE — Telephone Encounter (Signed)
 Patient called and stated that she is needing to have a office visit with Dr. Leigh because she keeps having symptoms and they change constantly. Patient stated that she was seen last week in the ED for severe abdominal pain, she stated that she felt like a twisting sensations. Since then she has had diarrhea, gas,sulfur burps, cramping, fatigue and constipation. Patient is very frustrated due to the fact that her symptoms just keep changing and she is having to miss work which has caused issues at work for her. Patient is requesting a call back. Please advise.

## 2023-07-24 NOTE — Progress Notes (Unsigned)
 Ellouise Console, PA-C [address] [place], [place]  [address] Phone: [phone]   Primary Care Physician: Cleotilde, [place]  E, PA  Primary Gastroenterologist:  Ellouise Console, PA-C / Elspeth Naval, MD   Chief Complaint: Abdominal pain and nausea       HPI:   Cynthia Ross is a 23 y.o. female presents for ED followup of generalized abdominal pain and nausea.  She went to [facility] ED 07/17/2023 for evaluation.  Labs significant for mildly elevated ALT liver test 60.  All other LFTs normal.  Normal lipase.  Elevated white count 12.7, hemoglobin 15.4, MCV 78.9.  Glucose 124.  Urine pregnancy test negative.  Urine drug screen positive for benzodiazepine, otherwise negative.  Negative cannabinoid.  Urine culture grew lactobacillus.    07/18/2023 CT abdomen pelvis with contrast: No acute abnormality.  Normal.  07/18/2023 RUQ ultrasound: No acute abnormality.  Normal gallbladder.  No gallstones.  CBD 3 mm.  Normal liver.  Current Symptoms: She has chronic abdominal pain and chronic nausea.  She has previously tried omeprazole , Zofran , Elavil , and Bentyl .  She did not tolerate Elavil  due to emotional outburst.  Tried Cymbalta  30 Mg daily which helped.  She is followed by psychiatrist and has taken Vraylar.  Sees therapist for depression.  Has alternating episodes of diarrhea and constipation.  Zofran  helps with nausea.  Denies vomiting.  Denies marijuana or NSAID use.  Omeprazole  40 Mg twice daily helps her GERD.  She tried MiraLAX  for constipation and IBgard for cramping.  PMH: BiPolar, Anxiety, PCOS, depression, PTSD, GERD and IBS.  History of iron  deficiency anemia and menorrhagia.  07/2021 EGD by Dr. Naval: Normal.  Biopsies showed chronic gastritis.  Biopsies negative for H. pylori and celiac.  07/2021 colonoscopy: 20 to 25 mm sessile serrated polyp removed from ascending colon.  No dysplasia.  Internal hemorrhoids.  Otherwise normal.  Biopsies negative for microscopic colitis and  IBD.  1 year repeat colonoscopy was recommended, which is overdue.  Current Outpatient Medications  Medication Sig Dispense Refill   buPROPion  (WELLBUTRIN  XL) 150 MG 24 hr tablet Take 1 tablet (150 mg total) by mouth daily.Take with 300mg . 90 tablet 0   buPROPion  (WELLBUTRIN  XL) 150 MG 24 hr tablet Take 1 tablet (150 mg total) by mouth every morning. 30 tablet 0   buPROPion  (WELLBUTRIN  XL) 300 MG 24 hr tablet Take 1 tablet (300 mg total) by mouth daily.Take with 150mg . 90 tablet 0   buPROPion  (WELLBUTRIN  XL) 300 MG 24 hr tablet Take 1 tablet (300 mg total) by mouth daily. 90 tablet 0   busPIRone  (BUSPAR ) 15 MG tablet Take 1 tablet (15 mg total) by mouth in the morning AND 2 tablets (30 mg total) at bedtime. 180 tablet 0   clindamycin  (CLEOCIN  T) 1 % external solution Apply 1 Application topically 2 (two) times daily. 60 mL 2   dicyclomine  (BENTYL ) 20 MG tablet Take 1 tablet (20 mg total) by mouth every 8 (eight) hours as needed. 15 tablet 0   drospirenone -ethinyl estradiol  (YASMIN ) 3-0.03 MG tablet Take 1 tablet by mouth daily. 84 tablet 1   famotidine  (PEPCID ) 40 MG tablet Take 1 tablet (40 mg total) by mouth daily. 30 tablet 3   lamoTRIgine  (LAMICTAL ) 25 MG tablet Take 2 tablets (50 mg total) by mouth 2 (two) times daily. 120 tablet 0   LORazepam  (ATIVAN ) 1 MG tablet Take 1 tablet (1 mg total) by mouth 2 (two) times daily. 60 tablet 0  LORazepam  (ATIVAN ) 1 MG tablet Take 1 tablet (1 mg total) by mouth 2 (two) times daily. 60 tablet 0   omeprazole  (PRILOSEC ) 40 MG capsule Take 1 capsule (40 mg total) by mouth 2 (two) times daily. 180 capsule 0   ondansetron  (ZOFRAN ) 4 MG tablet Take 1 tablet (4 mg total) by mouth every 8 (eight) hours as needed. 30 tablet 2   ondansetron  (ZOFRAN ) 4 MG tablet Take 1 tablet (4 mg total) by mouth 3 (three) times daily as needed. PLEASE KEEP 06/2023 APPOINTMENT 30 tablet 0   phentermine  (ADIPEX-P ) 37.5 MG tablet Take 1 tablet (37.5 mg total) by mouth daily before  breakfast. 30 tablet 0   promethazine  (PHENERGAN ) 25 MG tablet Take 1 tablet (25 mg total) by mouth every 6 (six) hours as needed. 15 tablet 0   Sodium Sulfate-Mag Sulfate-KCl (SUTAB ) 1479-225-188 MG TABS Use as directed for colonoscopy. 24 tablet 0   No current facility-administered medications for this visit.    Allergies as of 07/25/2023 - Review Complete 07/17/2023  Allergen Reaction Noted   Haldol [haloperidol] Other (See Comments) 07/18/2023   Other  08/17/2018   Reglan  [metoclopramide ] Other (See Comments) 07/18/2023    Past Medical History:  Diagnosis Date   Anemia    Anxiety    Bipolar depression (HCC)    Borderline personality disorder (HCC)    Depression    GERD (gastroesophageal reflux disease)    Hypertension    IBS (irritable bowel syndrome)    PCOS (polycystic ovarian syndrome)    Post traumatic stress disorder (PTSD)     Past Surgical History:  Procedure Laterality Date   None      Review of Systems:    All systems reviewed and negative except where noted in HPI.    Physical Exam:  LMP 06/16/2023 (Exact Date)  Patient's last menstrual period was 06/16/2023 (exact date).  General: Well-nourished, well-developed in no acute distress.  Lungs: Clear to auscultation bilaterally. Non-labored. Heart: Regular rate and rhythm, no murmurs rubs or gallops.  Abdomen: Bowel sounds are normal; Abdomen is Soft; No hepatosplenomegaly, masses or hernias;  No Abdominal Tenderness; No guarding or rebound tenderness. Neuro: Alert and oriented x 3.  Grossly intact.  Psych: Alert and cooperative, normal mood and affect.   Imaging Studies: CT ABDOMEN PELVIS W CONTRAST Result Date: 07/18/2023 CLINICAL DATA:  Abdominal pain EXAM: CT ABDOMEN AND PELVIS WITH CONTRAST TECHNIQUE: Multidetector CT imaging of the abdomen and pelvis was performed using the standard protocol following bolus administration of intravenous contrast. RADIATION DOSE REDUCTION: This exam was performed  according to the departmental dose-optimization program which includes automated exposure control, adjustment of the mA and/or kV according to patient size and/or use of iterative reconstruction technique. CONTRAST:  OMNIPAQUE  IOHEXOL  300 MG/ML  SOLN COMPARISON:  07/26/2020 FINDINGS: Lower Chest: Normal. Hepatobiliary: Normal hepatic contours. No intra- or extrahepatic biliary dilatation. The gallbladder is normal. Pancreas: Normal pancreas. No ductal dilatation or peripancreatic fluid collection. Spleen: Normal. Adrenals/Urinary Tract: The adrenal glands are normal. No hydronephrosis, nephroureterolithiasis or solid renal mass. The urinary bladder is normal for degree of distention Stomach/Bowel: There is no hiatal hernia. Normal duodenal course and caliber. No small bowel dilatation or inflammation. No focal colonic abnormality. Normal appendix. Vascular/Lymphatic: Normal course and caliber of the major abdominal vessels. No abdominal or pelvic lymphadenopathy. Reproductive: Normal uterus. No adnexal mass. Other: None. Musculoskeletal: No bony spinal canal stenosis or focal osseous abnormality. IMPRESSION: No acute abnormality of the abdomen or pelvis. Electronically Signed  By: Franky Stanford M.D.   On: 07/18/2023 03:18   US  ABDOMEN LIMITED RUQ (LIVER/GB) Result Date: 07/18/2023 CLINICAL DATA:  Right upper quadrant pain EXAM: ULTRASOUND ABDOMEN LIMITED RIGHT UPPER QUADRANT COMPARISON:  07/26/2020 FINDINGS: Gallbladder: No gallstones or wall thickening visualized. No sonographic [place] sign noted by sonographer. Common bile duct: Diameter: 3 mm Liver: No focal lesion identified. Within normal limits in parenchymal echogenicity. Portal vein is patent on color Doppler imaging with normal direction of blood flow towards the liver. Other: None. IMPRESSION: No acute abnormality noted. Electronically Signed   By: Oneil Devonshire M.D.   On: 07/18/2023 00:30    Labs: CBC    Component Value Date/Time   WBC  12.7 (H) 07/17/2023 2247   RBC 5.65 (H) 07/17/2023 2247   HGB 15.4 (H) 07/17/2023 2247   HGB 13.6 12/22/2022 0825   HGB 13.7 01/11/2017 1034   HCT 44.6 07/17/2023 2247   HCT 40.8 01/11/2017 1034   PLT 277 07/17/2023 2247   PLT 244 12/22/2022 0825   PLT 255 01/11/2017 1034   MCV 78.9 (L) 07/17/2023 2247   MCV 81 01/11/2017 1034   MCH 27.3 07/17/2023 2247   MCHC 34.5 07/17/2023 2247   RDW 12.4 07/17/2023 2247   RDW 14.1 01/11/2017 1034   LYMPHSABS 1.5 12/22/2022 0825   MONOABS 0.4 12/22/2022 0825   EOSABS 0.2 12/22/2022 0825   BASOSABS 0.0 12/22/2022 0825    CMP     Component Value Date/Time   NA 135 07/17/2023 2247   NA 142 01/11/2017 1034   K 3.7 07/17/2023 2247   CL 107 07/17/2023 2247   CO2 19 (L) 07/17/2023 2247   GLUCOSE 124 (H) 07/17/2023 2247   BUN 12 07/17/2023 2247   BUN 8 01/11/2017 1034   CREATININE 0.74 07/17/2023 2247   CALCIUM 9.4 07/17/2023 2247   PROT 8.1 07/17/2023 2247   PROT 7.1 01/11/2017 1034   ALBUMIN 4.2 07/17/2023 2247   ALBUMIN 4.4 01/11/2017 1034   AST 26 07/17/2023 2247   ALT 60 (H) 07/17/2023 2247   ALKPHOS 46 07/17/2023 2247   BILITOT 0.7 07/17/2023 2247   BILITOT <0.2 01/11/2017 1034   GFRNONAA >60 07/17/2023 2247   GFRAA 148 01/11/2017 1034       Assessment and Plan:   Arryn Terrones Shafran is a 23 y.o. y/o female returns for follow-up of chronic functional GI symptoms including abdominal pain, chronic nausea, GERD, IBS, and irregular bowel habits.  1.  Irritable bowel syndrome  2.  GERD/gastritis  3.  Chronic nausea  4.  Large sessile serrated polyp removed from ascending colon 07/2021.  Overdue for 1 year repeat surveillance colonoscopy.  5.  Anxiety and depression: Likely contributing to GI symptoms - Continue Cymbalta  and Vraylar - Continue follow-up with psychiatrist    Ellouise Console, PA-C  Follow up ***

## 2023-07-25 ENCOUNTER — Encounter: Payer: Self-pay | Admitting: Oncology

## 2023-07-25 ENCOUNTER — Encounter: Payer: Self-pay | Admitting: Physician Assistant

## 2023-07-25 ENCOUNTER — Other Ambulatory Visit: Payer: Self-pay

## 2023-07-25 ENCOUNTER — Other Ambulatory Visit (INDEPENDENT_AMBULATORY_CARE_PROVIDER_SITE_OTHER)

## 2023-07-25 ENCOUNTER — Ambulatory Visit (INDEPENDENT_AMBULATORY_CARE_PROVIDER_SITE_OTHER): Admitting: Physician Assistant

## 2023-07-25 VITALS — BP 124/82 | HR 103 | Ht 64.0 in | Wt 177.0 lb

## 2023-07-25 DIAGNOSIS — R7989 Other specified abnormal findings of blood chemistry: Secondary | ICD-10-CM

## 2023-07-25 DIAGNOSIS — R1011 Right upper quadrant pain: Secondary | ICD-10-CM

## 2023-07-25 DIAGNOSIS — K219 Gastro-esophageal reflux disease without esophagitis: Secondary | ICD-10-CM | POA: Diagnosis not present

## 2023-07-25 DIAGNOSIS — K2970 Gastritis, unspecified, without bleeding: Secondary | ICD-10-CM | POA: Diagnosis not present

## 2023-07-25 DIAGNOSIS — Z860101 Personal history of adenomatous and serrated colon polyps: Secondary | ICD-10-CM

## 2023-07-25 DIAGNOSIS — D72829 Elevated white blood cell count, unspecified: Secondary | ICD-10-CM

## 2023-07-25 DIAGNOSIS — K582 Mixed irritable bowel syndrome: Secondary | ICD-10-CM

## 2023-07-25 DIAGNOSIS — R1084 Generalized abdominal pain: Secondary | ICD-10-CM

## 2023-07-25 DIAGNOSIS — R110 Nausea: Secondary | ICD-10-CM

## 2023-07-25 LAB — CBC WITH DIFFERENTIAL/PLATELET
Basophils Absolute: 0 10*3/uL (ref 0.0–0.1)
Basophils Relative: 0.4 % (ref 0.0–3.0)
Eosinophils Absolute: 0.1 10*3/uL (ref 0.0–0.7)
Eosinophils Relative: 1.3 % (ref 0.0–5.0)
HCT: 43 % (ref 36.0–46.0)
Hemoglobin: 14.6 g/dL (ref 12.0–15.0)
Lymphocytes Relative: 25.6 % (ref 12.0–46.0)
Lymphs Abs: 1.7 10*3/uL (ref 0.7–4.0)
MCHC: 34 g/dL (ref 30.0–36.0)
MCV: 79.4 fl (ref 78.0–100.0)
Monocytes Absolute: 0.3 10*3/uL (ref 0.1–1.0)
Monocytes Relative: 5.1 % (ref 3.0–12.0)
Neutro Abs: 4.5 10*3/uL (ref 1.4–7.7)
Neutrophils Relative %: 67.6 % (ref 43.0–77.0)
Platelets: 278 10*3/uL (ref 150.0–400.0)
RBC: 5.42 Mil/uL — ABNORMAL HIGH (ref 3.87–5.11)
RDW: 13.8 % (ref 11.5–15.5)
WBC: 6.7 10*3/uL (ref 4.0–10.5)

## 2023-07-25 LAB — HEPATIC FUNCTION PANEL
ALT: 71 U/L — ABNORMAL HIGH (ref 0–35)
AST: 26 U/L (ref 0–37)
Albumin: 4.5 g/dL (ref 3.5–5.2)
Alkaline Phosphatase: 44 U/L (ref 39–117)
Bilirubin, Direct: 0.1 mg/dL (ref 0.0–0.3)
Total Bilirubin: 0.3 mg/dL (ref 0.2–1.2)
Total Protein: 7.6 g/dL (ref 6.0–8.3)

## 2023-07-25 MED ORDER — OMEPRAZOLE 40 MG PO CPDR
40.0000 mg | DELAYED_RELEASE_CAPSULE | Freq: Two times a day (BID) | ORAL | 3 refills | Status: DC
Start: 2023-07-25 — End: 2023-08-25
  Filled 2023-07-25: qty 180, 90d supply, fill #0

## 2023-07-25 MED ORDER — ONDANSETRON HCL 4 MG PO TABS
4.0000 mg | ORAL_TABLET | Freq: Three times a day (TID) | ORAL | 2 refills | Status: DC | PRN
  Filled 2023-07-25: qty 30, 10d supply, fill #0
  Filled 2023-08-27: qty 30, 10d supply, fill #1
  Filled 2023-09-27: qty 30, 10d supply, fill #2

## 2023-07-25 MED ORDER — FAMOTIDINE 40 MG PO TABS
40.0000 mg | ORAL_TABLET | Freq: Every day | ORAL | 3 refills | Status: DC
Start: 2023-07-25 — End: 2023-08-25
  Filled 2023-07-25 – 2023-08-07 (×2): qty 90, 90d supply, fill #0

## 2023-07-25 MED ORDER — DICYCLOMINE HCL 20 MG PO TABS: 20.0000 mg | ORAL_TABLET | Freq: Three times a day (TID) | ORAL | 3 refills | Status: DC | PRN

## 2023-07-25 NOTE — Progress Notes (Signed)
 Agree with assessment and plan as outlined.

## 2023-07-25 NOTE — Patient Instructions (Addendum)
 Your provider has requested that you go to the basement level for lab work before leaving today. Press B on the elevator. The lab is located at the first door on the left as you exit the elevator.  We have sent the following medications to your pharmacy for you to pick up at your convenience: Dicyclomine  20mg  every 8 hours, Famotidine  40mg  dail, Omeprazole  40mg  twice dailly and Ondansetron  4mg  3 times daily as needed  You have been scheduled for a HIDA scan at [facility] (1st floor) on 08/22/23. Please arrive 30 minutes prior to your scheduled appointment at  1:99jf. Make certain not to have anything to eat or drink after midnight prior to your test. Should this appointment date or time not work well for you, please call radiology scheduling at [phone].  _____________________________________________________________________ hepatobiliary (HIDA) scan is an imaging procedure used to diagnose problems in the liver, gallbladder and bile ducts. In the HIDA scan, a radioactive chemical or tracer is injected into a vein in your arm. The tracer is handled by the liver like bile. Bile is a fluid produced and excreted by your liver that helps your digestive system break down fats in the foods you eat. Bile is stored in your gallbladder and the gallbladder releases the bile when you eat a meal. A special nuclear medicine scanner (gamma camera) tracks the flow of the tracer from your liver into your gallbladder and small intestine.  During your HIDA scan  You'll be asked to change into a hospital gown before your HIDA scan begins. Your health care team will position you on a table, usually on your back. The radioactive tracer is then injected into a vein in your arm.The tracer travels through your bloodstream to your liver, where it's taken up by the bile-producing cells. The radioactive tracer travels with the bile from your liver into your gallbladder and through your bile ducts to your small  intestine.You may feel some pressure while the radioactive tracer is injected into your vein. As you lie on the table, a special gamma camera is positioned over your abdomen taking pictures of the tracer as it moves through your body. The gamma camera takes pictures continually for about an hour. You'll need to keep still during the HIDA scan. This can become uncomfortable, but you may find that you can lessen the discomfort by taking deep breaths and thinking about other things. Tell your health care team if you're uncomfortable. The radiologist will watch on a computer the progress of the radioactive tracer through your body. The HIDA scan may be stopped when the radioactive tracer is seen in the gallbladder and enters your small intestine. This typically takes about an hour. In some cases extra imaging will be performed if original images aren't satisfactory, if morphine  is given to help visualize the gallbladder or if the medication CCK is given to look at the contraction of the gallbladder. This test typically takes 2 hours to complete. ________________________________________________________________________ Thank you for trusting me with your gastrointestinal care!   Ellouise Console, PA-C  You have been scheduled for a colonoscopy. Please follow written instructions given to you at your visit today.   If you use inhalers (even only as needed), please bring them with you on the day of your procedure.  DO NOT TAKE 7 DAYS PRIOR TO TEST- Trulicity (dulaglutide) Ozempic, Wegovy (semaglutide) Mounjaro (tirzepatide) Bydureon Bcise (exanatide extended release)  DO NOT TAKE 1 DAY PRIOR TO YOUR TEST Rybelsus (semaglutide) Adlyxin (lixisenatide) Victoza (liraglutide) Byetta (  exanatide) ___________________________________________________________________________  Please follow up sooner if symptoms increase or worsen   Due to recent changes in healthcare laws, you may see the results of your imaging  and laboratory studies on MyChart before your provider has had a chance to review them.  We understand that in some cases there may be results that are confusing or concerning to you. Not all laboratory results come back in the same time frame and the provider may be waiting for multiple results in order to interpret others.  Please give us  48 hours in order for your provider to thoroughly review all the results before contacting the office for clarification of your results.  _______________________________________________________  If your blood pressure at your visit was 140/90 or greater, please contact your primary care physician to follow up on this.  _______________________________________________________  If you are age 42 or older, your body mass index should be between 23-30. Your Body mass index is 30.38 kg/m. If this is out of the aforementioned range listed, please consider follow up with your Primary Care Provider.  If you are age 35 or younger, your body mass index should be between 19-25. Your Body mass index is 30.38 kg/m. If this is out of the aformentioned range listed, please consider follow up with your Primary Care Provider.   ________________________________________________________  The [facility] providers would like to encourage you to use MYCHART to communicate with providers for non-urgent requests or questions.  Due to long hold times on the telephone, sending your provider a message by [facility] may be a faster and more efficient way to get a response.  Please allow 48 business hours for a response.  Please remember that this is for non-urgent requests.  _______________________________________________________

## 2023-07-26 ENCOUNTER — Other Ambulatory Visit: Payer: Self-pay

## 2023-07-26 MED ORDER — BUPROPION HCL ER (XL) 150 MG PO TB24
150.0000 mg | ORAL_TABLET | Freq: Every morning | ORAL | 0 refills | Status: DC
  Filled 2023-08-25 – 2023-09-20 (×2): qty 90, 90d supply, fill #0

## 2023-07-28 ENCOUNTER — Ambulatory Visit: Payer: Self-pay | Admitting: Physician Assistant

## 2023-07-28 DIAGNOSIS — M2550 Pain in unspecified joint: Secondary | ICD-10-CM

## 2023-07-28 DIAGNOSIS — R768 Other specified abnormal immunological findings in serum: Secondary | ICD-10-CM

## 2023-07-29 LAB — HEPATITIS B CORE ANTIBODY, IGM: Hep B C IgM: NONREACTIVE

## 2023-07-29 LAB — ALPHA-GAL PANEL
Allergen, Mutton, f88: <0.1 kU/L
Allergen, Pork, f26: <0.1 kU/L
Beef: <0.1 kU/L
CLASS: 0
CLASS: 0
Class: 0
GALACTOSE-ALPHA-1,3-GALACTOSE IGE*: <0.1 kU/L (ref <0.10)

## 2023-07-29 LAB — ANA: Anti Nuclear Antibody (ANA): POSITIVE — AB

## 2023-07-29 LAB — HEPATITIS A ANTIBODY, IGM: Hep A IgM: NONREACTIVE

## 2023-07-29 LAB — ALPHA-1-ANTITRYPSIN: A-1 Antitrypsin, Ser: 283 mg/dL — ABNORMAL HIGH (ref 83–199)

## 2023-07-29 LAB — IRON,TIBC AND FERRITIN PANEL
%SAT: 15 % — ABNORMAL LOW (ref 16–45)
Ferritin: 58 ng/mL (ref 16–154)
Iron: 66 ug/dL (ref 40–190)
TIBC: 428 ug/dL (ref 250–450)

## 2023-07-29 LAB — ANTI-NUCLEAR AB-TITER (ANA TITER): ANA Titer 1: 1:40 {titer} — ABNORMAL HIGH

## 2023-07-29 LAB — ANTI-SMOOTH MUSCLE ANTIBODY, IGG: Actin (Smooth Muscle) Antibody (IGG): <20 U (ref <20)

## 2023-07-29 LAB — INTERPRETATION:

## 2023-07-29 LAB — HEPATITIS C ANTIBODY: Hepatitis C Ab: NONREACTIVE

## 2023-07-29 LAB — HEPATITIS B SURFACE ANTIBODY,QUALITATIVE: Hep B S Ab: NONREACTIVE

## 2023-07-29 LAB — CERULOPLASMIN: Ceruloplasmin: 52 mg/dL — ABNORMAL HIGH (ref 14–48)

## 2023-07-29 LAB — HEPATITIS A ANTIBODY, TOTAL: Hepatitis A AB,Total: NONREACTIVE

## 2023-07-29 LAB — MITOCHONDRIAL ANTIBODIES: Mitochondrial M2 Ab, IgG: <=20 U (ref <=20.0)

## 2023-07-29 LAB — HEPATITIS B CORE ANTIBODY, TOTAL: Hep B Core Total Ab: REACTIVE — AB

## 2023-08-01 ENCOUNTER — Other Ambulatory Visit: Payer: Self-pay

## 2023-08-01 ENCOUNTER — Encounter: Payer: Self-pay | Admitting: Gastroenterology

## 2023-08-01 NOTE — Addendum Note (Signed)
 Addended by: CLAUDENE NAOMIE SAILOR on: 08/01/2023 01:46 PM   Modules accepted: Orders

## 2023-08-02 ENCOUNTER — Other Ambulatory Visit (INDEPENDENT_AMBULATORY_CARE_PROVIDER_SITE_OTHER)

## 2023-08-02 DIAGNOSIS — R768 Other specified abnormal immunological findings in serum: Secondary | ICD-10-CM

## 2023-08-04 LAB — IGG: IgG (Immunoglobin G), Serum: 990 mg/dL (ref 600–1640)

## 2023-08-04 LAB — HEPATITIS B DNA, ULTRAQUANTITATIVE, PCR
Hepatitis B DNA: NOT DETECTED [IU]/mL
Hepatitis B virus DNA: NOT DETECTED {Log_IU}/mL

## 2023-08-04 LAB — HEPATITIS B SURFACE ANTIGEN: Hepatitis B Surface Ag: NONREACTIVE

## 2023-08-05 ENCOUNTER — Other Ambulatory Visit: Payer: Self-pay

## 2023-08-05 MED ORDER — SUCRALFATE 1 G PO TABS
1.0000 g | ORAL_TABLET | Freq: Three times a day (TID) | ORAL | 0 refills | Status: DC
  Filled 2023-08-05: qty 90, 30d supply, fill #0

## 2023-08-07 ENCOUNTER — Other Ambulatory Visit: Payer: Self-pay

## 2023-08-08 ENCOUNTER — Other Ambulatory Visit: Payer: Self-pay

## 2023-08-08 ENCOUNTER — Ambulatory Visit (AMBULATORY_SURGERY_CENTER): Admitting: Gastroenterology

## 2023-08-08 ENCOUNTER — Encounter: Payer: Self-pay | Admitting: Gastroenterology

## 2023-08-08 ENCOUNTER — Telehealth: Payer: Self-pay

## 2023-08-08 ENCOUNTER — Ambulatory Visit: Payer: Self-pay | Admitting: Physician Assistant

## 2023-08-08 VITALS — BP 125/64 | HR 64 | Temp 98.1°F | Resp 13 | Ht 64.0 in | Wt 177.0 lb

## 2023-08-08 DIAGNOSIS — K635 Polyp of colon: Secondary | ICD-10-CM | POA: Diagnosis not present

## 2023-08-08 DIAGNOSIS — Z1211 Encounter for screening for malignant neoplasm of colon: Secondary | ICD-10-CM | POA: Diagnosis present

## 2023-08-08 DIAGNOSIS — Z860100 Personal history of colon polyps, unspecified: Secondary | ICD-10-CM

## 2023-08-08 DIAGNOSIS — D122 Benign neoplasm of ascending colon: Secondary | ICD-10-CM

## 2023-08-08 MED ORDER — SODIUM CHLORIDE 0.9 % IV SOLN: 500.0000 mL | INTRAVENOUS | Status: DC

## 2023-08-08 NOTE — Progress Notes (Signed)
 [facility] History and Physical   Primary Care Physician:  Cleotilde, [place]  E, PA   Reason for Procedure:   History of colon polyps  Plan:    colonoscopy     HPI: Cynthia Ross is a 23 y.o. female  here for colonoscopy surveillance - 25mm SSP removed in piecemeal in 07/2021. Patient has some baseline IBS.    Otherwise feels well without any cardiopulmonary symptoms.   I have discussed risks / benefits of anesthesia and endoscopic procedure with Mardy CHRISTELLA Stankovic and they wish to proceed with the exams as outlined today.    Past Medical History:  Diagnosis Date   Anemia    Anxiety    Bipolar depression (HCC)    Borderline personality disorder (HCC)    Depression    GERD (gastroesophageal reflux disease)    Hypertension    IBS (irritable bowel syndrome)    PCOS (polycystic ovarian syndrome)    Post traumatic stress disorder (PTSD)     Past Surgical History:  Procedure Laterality Date   None      Prior to Admission medications   Medication Sig Start Date End Date Taking? Authorizing Provider  buPROPion  (WELLBUTRIN  XL) 150 MG 24 hr tablet Take 1 tablet (150 mg total) by mouth in the morning. 07/25/23  Yes   buPROPion  (WELLBUTRIN  XL) 300 MG 24 hr tablet Take 1 tablet (300 mg total) by mouth daily.Take with 150mg . 06/13/23  Yes   busPIRone  (BUSPAR ) 15 MG tablet Take 1 tablet (15 mg total) by mouth in the morning AND 2 tablets (30 mg total) at bedtime. 07/06/23  Yes   drospirenone -ethinyl estradiol  (YASMIN ) 3-0.03 MG tablet Take 1 tablet by mouth daily. 07/01/23  Yes   famotidine  (PEPCID ) 40 MG tablet Take 1 tablet (40 mg total) by mouth daily. 07/25/23  Yes [place], PA-C  lamoTRIgine  (LAMICTAL ) 25 MG tablet Take 2 tablets (50 mg total) by mouth 2 (two) times daily. 06/08/23  Yes   LORazepam  (ATIVAN ) 1 MG tablet Take 1 tablet (1 mg total) by mouth 2 (two) times daily. 06/08/23  Yes   omeprazole  (PRILOSEC ) 40 MG capsule Take 1 capsule (40 mg total) by mouth 2  (two) times daily. 07/25/23  Yes [place], PA-C  ondansetron  (ZOFRAN ) 4 MG tablet Take 1 tablet (4 mg total) by mouth 3 (three) times daily as needed. PLEASE KEEP 06/2023 APPOINTMENT 07/25/23  Yes [place], PA-C  promethazine  (PHENERGAN ) 25 MG tablet Take 1 tablet (25 mg total) by mouth every 6 (six) hours as needed. 07/18/23  Yes Ward, Josette SAILOR, DO  buPROPion  (WELLBUTRIN  XL) 150 MG 24 hr tablet Take 1 tablet (150 mg total) by mouth daily.Take with 300mg . Patient not taking: Reported on 08/08/2023 06/13/23     buPROPion  (WELLBUTRIN  XL) 150 MG 24 hr tablet Take 1 tablet (150 mg total) by mouth every morning. Patient not taking: Reported on 08/08/2023 06/14/23     buPROPion  (WELLBUTRIN  XL) 300 MG 24 hr tablet Take 1 tablet (300 mg total) by mouth daily. Patient not taking: Reported on 08/08/2023 06/14/23     clindamycin  (CLEOCIN  T) 1 % external solution Apply 1 Application topically 2 (two) times daily. 05/06/23     dicyclomine  (BENTYL ) 20 MG tablet Take 1 tablet (20 mg total) by mouth every 8 (eight) hours as needed. 07/25/23   [place], PA-C  phentermine  (ADIPEX-P ) 37.5 MG tablet Take 1 tablet (37.5 mg total) by mouth daily before breakfast. 06/27/23     sucralfate  (CARAFATE ) 1  g tablet Dissolve 1 tablet (1 g total) in warm water and drink, 3 (three) times daily. Patient not taking: Reported on 08/08/2023 08/05/23   [place], PA-C  hyoscyamine  (LEVSIN , ANASPAZ ) 0.125 MG tablet Take 1 tablet (0.125 mg total) by mouth every 6 (six) hours as needed. Patient not taking: Reported on 12/16/2017 12/14/16 08/17/18  Signa Delon LABOR, NP    Current Outpatient Medications  Medication Sig Dispense Refill   buPROPion  (WELLBUTRIN  XL) 150 MG 24 hr tablet Take 1 tablet (150 mg total) by mouth in the morning. 90 tablet 0   buPROPion  (WELLBUTRIN  XL) 300 MG 24 hr tablet Take 1 tablet (300 mg total) by mouth daily.Take with 150mg . 90 tablet 0   busPIRone  (BUSPAR ) 15 MG tablet Take 1 tablet (15 mg total) by  mouth in the morning AND 2 tablets (30 mg total) at bedtime. 180 tablet 0   drospirenone -ethinyl estradiol  (YASMIN ) 3-0.03 MG tablet Take 1 tablet by mouth daily. 84 tablet 1   famotidine  (PEPCID ) 40 MG tablet Take 1 tablet (40 mg total) by mouth daily. 90 tablet 3   lamoTRIgine  (LAMICTAL ) 25 MG tablet Take 2 tablets (50 mg total) by mouth 2 (two) times daily. 120 tablet 0   LORazepam  (ATIVAN ) 1 MG tablet Take 1 tablet (1 mg total) by mouth 2 (two) times daily. 60 tablet 0   omeprazole  (PRILOSEC ) 40 MG capsule Take 1 capsule (40 mg total) by mouth 2 (two) times daily. 180 capsule 3   ondansetron  (ZOFRAN ) 4 MG tablet Take 1 tablet (4 mg total) by mouth 3 (three) times daily as needed. PLEASE KEEP 06/2023 APPOINTMENT 30 tablet 2   promethazine  (PHENERGAN ) 25 MG tablet Take 1 tablet (25 mg total) by mouth every 6 (six) hours as needed. 15 tablet 0   buPROPion  (WELLBUTRIN  XL) 150 MG 24 hr tablet Take 1 tablet (150 mg total) by mouth daily.Take with 300mg . (Patient not taking: Reported on 08/08/2023) 90 tablet 0   buPROPion  (WELLBUTRIN  XL) 150 MG 24 hr tablet Take 1 tablet (150 mg total) by mouth every morning. (Patient not taking: Reported on 08/08/2023) 30 tablet 0   buPROPion  (WELLBUTRIN  XL) 300 MG 24 hr tablet Take 1 tablet (300 mg total) by mouth daily. (Patient not taking: Reported on 08/08/2023) 90 tablet 0   clindamycin  (CLEOCIN  T) 1 % external solution Apply 1 Application topically 2 (two) times daily. 60 mL 2   dicyclomine  (BENTYL ) 20 MG tablet Take 1 tablet (20 mg total) by mouth every 8 (eight) hours as needed. 30 tablet 3   phentermine  (ADIPEX-P ) 37.5 MG tablet Take 1 tablet (37.5 mg total) by mouth daily before breakfast. 30 tablet 0   sucralfate  (CARAFATE ) 1 g tablet Dissolve 1 tablet (1 g total) in warm water and drink, 3 (three) times daily. (Patient not taking: Reported on 08/08/2023) 90 tablet 0   Current Facility-Administered Medications  Medication Dose Route Frequency Provider Last Rate  Last Admin   0.9 %  sodium chloride  infusion  500 mL Intravenous Continuous Darrelle Wiberg, Elspeth SQUIBB, MD        Allergies as of 08/08/2023 - Review Complete 08/08/2023  Allergen Reaction Noted   Haldol [haloperidol] Other (See Comments) 07/18/2023   Reglan  [metoclopramide ] Other (See Comments) 07/18/2023   Tramadol  Other (See Comments) 08/08/2023   Other Other (See Comments) 08/17/2018    Family History  Problem Relation Age of Onset   Mental illness Mother    Cancer Mother        ovary  Endometriosis Mother    Depression Father    Irritable bowel syndrome Father    COPD Maternal Grandmother    Cancer Maternal Grandmother        ovary   Colon cancer Neg Hx    Rectal cancer Neg Hx    Esophageal cancer Neg Hx    Stomach cancer Neg Hx     Social History   Socioeconomic History   Marital status: Single    Spouse name: Not on file   Number of children: Not on file   Years of education: Not on file   Highest education level: Not on file  Occupational History   Not on file  Tobacco Use   Smoking status: Never   Smokeless tobacco: Never  Vaping Use   Vaping status: Never Used  Substance and Sexual Activity   Alcohol use: Yes    Comment: once monthly   Drug use: Not Currently    Frequency: 1.0 times per week    Types: Marijuana   Sexual activity: Not Currently    Birth control/protection: None  Other Topics Concern   Not on file  Social History Narrative   Not on file   Social Drivers of Health   Financial Resource Strain: Low Risk  (09/21/2022)   Overall Financial Resource Strain (CARDIA)    Difficulty of Paying Living Expenses: Not very hard  Food Insecurity: No Food Insecurity (09/21/2022)   Hunger Vital Sign    Worried About Running Out of Food in the Last Year: Never true    Ran Out of Food in the Last Year: Never true  Transportation Needs: No Transportation Needs (09/21/2022)   PRAPARE - Administrator, Civil Service (Medical): No    Lack of  Transportation (Non-Medical): No  Physical Activity: Not on file  Stress: No Stress Concern Present (09/21/2022)   [employer] of Occupational Health - Occupational Stress Questionnaire    Feeling of Stress : Only a little  Social Connections: Not on file  Intimate Partner Violence: Not At Risk (09/21/2022)   Humiliation, Afraid, Rape, and Kick questionnaire    Fear of Current or Ex-Partner: No    Emotionally Abused: No    Physically Abused: No    Sexually Abused: No    Review of Systems: All other review of systems negative except as mentioned in the HPI.  Physical Exam: Vital signs BP (!) 142/95   Pulse 97   Temp 98.1 F (36.7 C)   Ht 5' 4 (1.626 m)   Wt 177 lb (80.3 kg)   LMP 07/25/2023 (Exact Date)   SpO2 100%   BMI 30.38 kg/m   General:   Alert,  Well-developed, pleasant and cooperative in NAD Lungs:  Clear throughout to auscultation.   Heart:  Regular rate and rhythm Abdomen:  Soft, nontender and nondistended.   Neuro/Psych:  Alert and cooperative. Normal mood and affect. A and O x 3  Marcey Naval, MD [facility]

## 2023-08-08 NOTE — Telephone Encounter (Signed)
-----   Message from Cynthia Ross sent at 08/08/2023 12:49 PM EDT ----- Regarding: RE: follow up appointment / mychart assistance Thanks, I think that would be best.  She requested to see me specifically for her next visit.  Thanks ----- Message ----- From: Cynthia Ross, CMA Sent: 08/08/2023  11:29 AM EDT To: Cynthia SHAUNNA Naval, MD Subject: RE: follow up appointment / rhona assistan#  She is already scheduled for an appointment with Ellouise on 9-22.  Would you like for me to put her in a Nurse visit spot with you? I could move her to 9-15 with you. Please advise ----- Message ----- From: Ross Cynthia SHAUNNA, MD Sent: 08/08/2023  10:49 AM EDT To: Clarita Ross Cynthia, CMA Subject: follow up appointment / rhona assistance     Jan can you help this patient with a few things: - she needs a follow up visit with me for abdominal pain, reflux - can you help coordinate, hopefully something in the next 1-2 months - she has had a hard time reaching me via mychart, says only Ellouise is listed for her. Not sure who we can ask to have me listed as a mychart contact so she can reach me directly? Thanks

## 2023-08-08 NOTE — Telephone Encounter (Signed)
 Appointment with Ellouise in Sept cancelled.  Patient has been scheduled with dr Leigh on 9-15. MyChart message to patient re Appointment info

## 2023-08-08 NOTE — Progress Notes (Signed)
 Report to PACU, RN, vss, BBS= Clear.

## 2023-08-08 NOTE — Patient Instructions (Signed)

## 2023-08-08 NOTE — Telephone Encounter (Signed)
-----   Message from Elspeth SHAUNNA Naval sent at 08/08/2023 10:48 AM EDT ----- Regarding: follow up appointment / rhona assistance Jan can you help this patient with a few things: - she needs a follow up visit with me for abdominal pain, reflux - can you help coordinate, hopefully something in the next 1-2 months - she has had a hard time reaching me via mychart, says only Ellouise is listed for her. Not sure who we can ask to have me listed as a mychart contact so she can reach me directly? Thanks

## 2023-08-08 NOTE — Telephone Encounter (Signed)
 MyChart message to patient.  Patient is currently scheduled with Ellouise on 9-22.  Message to Arm[place] to advise on moving her to his schedule in a nurse visit

## 2023-08-08 NOTE — Op Note (Signed)
 [facility] Patient Name: Cynthia Ross Procedure Date: 08/08/2023 9:32 AM MRN: [id number] Endoscopist: Elspeth P. Leigh , MD, [id number] Age: 23 Referring MD:  Date of Birth: 2000-11-07 Gender: Female Account #: [id number] Procedure:                Colonoscopy Indications:              High risk colon cancer surveillance: Personal                            history of colonic polyps - had a roughly 25mm SSP                            removed 07/2021 in piecemeal Medicines:                Monitored Anesthesia Care Procedure:                Pre-Anesthesia Assessment:                           - Prior to the procedure, a History and Physical                            was performed, and patient medications and                            allergies were reviewed. The patient's tolerance of                            previous anesthesia was also reviewed. The risks                            and benefits of the procedure and the sedation                            options and risks were discussed with the patient.                            All questions were answered, and informed consent                            was obtained. Prior Anticoagulants: The patient has                            taken no anticoagulant or antiplatelet agents. ASA                            Grade Assessment: II - A patient with mild systemic                            disease. After reviewing the risks and benefits,                            the patient was deemed in satisfactory condition to  undergo the procedure.                           After obtaining informed consent, the colonoscope                            was passed under direct vision. Throughout the                            procedure, the patient's blood pressure, pulse, and                            oxygen saturations were monitored continuously. The                            Olympus Scope SN [id number]  was introduced through the                            anus and advanced to the the cecum, identified by                            appendiceal orifice and ileocecal valve. The                            colonoscopy was performed without difficulty. The                            patient tolerated the procedure well. The quality                            of the bowel preparation was adequate. The                            ileocecal valve, appendiceal orifice, and rectum                            were photographed. Scope In: 9:48:28 AM Scope Out: 10:03:54 AM Scope Withdrawal Time: 0 hours 13 minutes 50 seconds  Total Procedure Duration: 0 hours 15 minutes 26 seconds  Findings:                 The perianal and digital rectal examinations were                            normal.                           A tattoo was seen in the ascending colon. No                            evidence of the previous polyp, site looked good.                           A 5 to 6 mm polyp was found in the ascending colon.  The polyp was flat. The polyp was removed with a                            cold snare. Resection and retrieval were complete.                           The exam was otherwise without abnormality. Complications:            No immediate complications. Estimated blood loss:                            Minimal. Estimated Blood Loss:     Estimated blood loss was minimal. Impression:               - A tattoo was seen in the ascending colon. A                            post-polypectomy scar was found at the tattoo site.                           - One 5 to 6 mm polyp in the ascending colon,                            removed with a cold snare. Resected and retrieved.                           - The examination was otherwise normal. Recommendation:           - Patient has a contact number available for                            emergencies. The signs and symptoms of  potential                            delayed complications were discussed with the                            patient. Return to normal activities tomorrow.                            Written discharge instructions were provided to the                            patient.                           - Resume previous diet.                           - Await pathology results.                           - Anticipate repeat colonoscopy in 3 years for  surveillance given the patient's age advance lesion                            on the last exam and new polyp found today                           - Of note, patient endorses worsening reflux                            depsite omeprazole  40mg  BID and pepcid . Recommend                            trial of free office samples of Voquezna 10mg  / day                            to see if that helps, will discuss with the patient Elspeth SQUIBB. Jaqua Ching, MD 08/08/2023 10:10:18 AM This report has been signed electronically.

## 2023-08-09 ENCOUNTER — Telehealth: Payer: Self-pay | Admitting: Physician Assistant

## 2023-08-09 ENCOUNTER — Telehealth: Payer: Self-pay

## 2023-08-09 NOTE — Telephone Encounter (Signed)
 Inbound call from patient stated she missed a call from nurse Dottie. Requesting a call back  Please advise  Thank you

## 2023-08-09 NOTE — Telephone Encounter (Signed)
 Unable to reach pt.  Left HIPAA compliant voicemail.

## 2023-08-10 ENCOUNTER — Ambulatory Visit: Payer: Self-pay | Admitting: Gastroenterology

## 2023-08-10 ENCOUNTER — Telehealth: Payer: Self-pay

## 2023-08-10 ENCOUNTER — Other Ambulatory Visit: Payer: Self-pay

## 2023-08-10 LAB — SURGICAL PATHOLOGY

## 2023-08-10 NOTE — Telephone Encounter (Signed)
 See 08/08/23 results follow up note for additional details.

## 2023-08-10 NOTE — Telephone Encounter (Signed)
 MyChart message to patient re: Voquezna samples mentioned on procedure report

## 2023-08-10 NOTE — Telephone Encounter (Signed)
-----   Message from Elspeth SHAUNNA Naval sent at 08/10/2023  4:25 PM EDT ----- Regarding: RE: Voquezna samples Jan I think we gave them to her, but can send her a message to double check? I can't recall specifically. Thanks ----- Message ----- From: Edmundo Clarita HERO, CMA Sent: 08/10/2023   3:31 PM EDT To: Elspeth SHAUNNA Naval, MD Subject: Voquezna samples                               When patient was here for her procedure on Monday, did you all give her Voquezna samples or do I need to contact her to see if she wants to come get some?   Thanks, Jan

## 2023-08-11 ENCOUNTER — Other Ambulatory Visit: Payer: Self-pay

## 2023-08-11 MED ORDER — LAMOTRIGINE 25 MG PO TABS
50.0000 mg | ORAL_TABLET | Freq: Two times a day (BID) | ORAL | 0 refills | Status: DC
  Filled 2023-08-11: qty 48, 12d supply, fill #0

## 2023-08-17 ENCOUNTER — Other Ambulatory Visit: Payer: Self-pay

## 2023-08-17 ENCOUNTER — Encounter: Payer: Self-pay | Admitting: Gastroenterology

## 2023-08-17 ENCOUNTER — Other Ambulatory Visit: Payer: Self-pay | Admitting: Physician Assistant

## 2023-08-17 DIAGNOSIS — K582 Mixed irritable bowel syndrome: Secondary | ICD-10-CM

## 2023-08-19 ENCOUNTER — Other Ambulatory Visit: Payer: Self-pay

## 2023-08-19 ENCOUNTER — Encounter: Payer: Self-pay | Admitting: Pharmacist

## 2023-08-19 ENCOUNTER — Telehealth: Payer: Self-pay | Admitting: Physician Assistant

## 2023-08-19 DIAGNOSIS — K582 Mixed irritable bowel syndrome: Secondary | ICD-10-CM

## 2023-08-19 MED ORDER — DICYCLOMINE HCL 20 MG PO TABS
20.0000 mg | ORAL_TABLET | Freq: Three times a day (TID) | ORAL | 3 refills | Status: DC | PRN
  Filled 2023-08-19: qty 30, 10d supply, fill #0
  Filled 2023-10-05: qty 30, 10d supply, fill #1

## 2023-08-19 MED ORDER — AMBULATORY NON FORMULARY MEDICATION: 0 refills | Status: AC

## 2023-08-19 NOTE — Telephone Encounter (Signed)
 Refill Dicyclomine ?

## 2023-08-22 ENCOUNTER — Inpatient Hospital Stay (HOSPITAL_COMMUNITY): Admission: RE | Admit: 2023-08-22 | Source: Ambulatory Visit

## 2023-08-22 ENCOUNTER — Encounter (HOSPITAL_COMMUNITY)
Admission: RE | Admit: 2023-08-22 | Discharge: 2023-08-22 | Disposition: A | Discharge status: Home / Self Care | Source: Ambulatory Visit | Attending: Physician Assistant | Admitting: Physician Assistant

## 2023-08-22 DIAGNOSIS — R110 Nausea: Secondary | ICD-10-CM | POA: Insufficient documentation

## 2023-08-22 DIAGNOSIS — R1084 Generalized abdominal pain: Secondary | ICD-10-CM | POA: Insufficient documentation

## 2023-08-22 MED ORDER — TECHNETIUM TC 99M MEBROFENIN IV KIT
5.3000 | PACK | Freq: Once | INTRAVENOUS | Status: AC | PRN
  Administered 2023-08-22: 5.3 via INTRAVENOUS

## 2023-08-25 ENCOUNTER — Other Ambulatory Visit: Payer: Self-pay

## 2023-08-25 ENCOUNTER — Encounter: Payer: Self-pay | Admitting: Gastroenterology

## 2023-08-25 MED ORDER — LAMOTRIGINE 25 MG PO TABS
25.0000 mg | ORAL_TABLET | Freq: Two times a day (BID) | ORAL | 0 refills | Status: DC
  Filled 2023-08-25: qty 120, 60d supply, fill #0

## 2023-08-25 MED ORDER — VOQUEZNA 10 MG PO TABS: 10.0000 mg | ORAL_TABLET | Freq: Every day | ORAL | 5 refills | Status: DC

## 2023-08-26 ENCOUNTER — Other Ambulatory Visit: Payer: Self-pay

## 2023-08-26 HISTORY — PX: COLONOSCOPY: SHX174

## 2023-08-28 ENCOUNTER — Encounter: Payer: Self-pay | Admitting: Gastroenterology

## 2023-08-29 ENCOUNTER — Other Ambulatory Visit: Payer: Self-pay

## 2023-08-29 ENCOUNTER — Encounter: Payer: Self-pay | Admitting: Oncology

## 2023-08-29 MED ORDER — RIFAXIMIN 550 MG PO TABS
550.0000 mg | ORAL_TABLET | Freq: Three times a day (TID) | ORAL | 0 refills | Status: AC
  Filled 2023-08-29: qty 42, 14d supply, fill #0

## 2023-08-29 NOTE — Telephone Encounter (Signed)
 Per Jenkins at [facility], provider has reviewed patient referral and states that ANA is 1:40 which is technically not positive and patient complaints joint pains may be better evaluated by PCP.   Patient is, however, scheduled to see Dr Jeannetta with [facility] on 01/05/24.

## 2023-08-29 NOTE — Telephone Encounter (Signed)
 Ellouise, I defer this message to you as this is a patient you have recently seen in office and you had a recent phone communication with patient.

## 2023-08-30 ENCOUNTER — Other Ambulatory Visit: Payer: Self-pay

## 2023-08-31 ENCOUNTER — Other Ambulatory Visit: Payer: Self-pay

## 2023-09-01 ENCOUNTER — Telehealth: Payer: Self-pay

## 2023-09-01 NOTE — Telephone Encounter (Signed)
 Pharmacy Patient Advocate Encounter   Received notification from Patient Pharmacy that prior authorization for Xifaxan  550MG  tablets is required/requested.   Insurance verification completed.   The patient is insured through [facility] .   Per test claim: Prior Authorization form/request asks a question that requires your assistance. Please see the question below and advise accordingly. The PA will not be submitted until the necessary information is received.

## 2023-09-01 NOTE — Telephone Encounter (Signed)
 Pharmacy Patient Advocate Encounter    Per test claim: PA required; PA submitted to above mentioned insurance via CoverMyMeds Key/confirmation #/EOC [id number] Status is pending

## 2023-09-02 NOTE — Telephone Encounter (Signed)
 Pharmacy Patient Advocate Encounter  Received notification from [employer] that Prior Authorization for Xifaxan  550MG  tablets has been APPROVED from 09-01-2023 to 09-15-2023   PA #/Case ID/Reference #: [id number]

## 2023-09-05 ENCOUNTER — Other Ambulatory Visit: Payer: Self-pay

## 2023-09-06 ENCOUNTER — Other Ambulatory Visit: Payer: Self-pay

## 2023-09-09 ENCOUNTER — Other Ambulatory Visit: Payer: Self-pay

## 2023-09-12 ENCOUNTER — Encounter: Admitting: Gastroenterology

## 2023-09-16 ENCOUNTER — Other Ambulatory Visit: Payer: Self-pay

## 2023-09-20 ENCOUNTER — Other Ambulatory Visit: Payer: Self-pay

## 2023-09-20 MED ORDER — LORAZEPAM 1 MG PO TABS
1.0000 mg | ORAL_TABLET | Freq: Two times a day (BID) | ORAL | 0 refills | Status: DC
  Filled 2023-09-20: qty 60, 30d supply, fill #0

## 2023-09-21 ENCOUNTER — Other Ambulatory Visit: Payer: Self-pay

## 2023-09-21 MED ORDER — SCOPOLAMINE 1 MG/3DAYS TD PT72
1.0000 | MEDICATED_PATCH | TRANSDERMAL | 0 refills | Status: DC | PRN
  Filled 2023-09-21: qty 3, 9d supply, fill #0

## 2023-09-28 ENCOUNTER — Other Ambulatory Visit: Payer: Self-pay

## 2023-09-28 ENCOUNTER — Encounter: Payer: Self-pay | Admitting: Gastroenterology

## 2023-09-28 MED ORDER — PHENTERMINE HCL 37.5 MG PO TABS
37.5000 mg | ORAL_TABLET | Freq: Every day | ORAL | 1 refills | Status: AC
  Filled 2023-11-07: qty 30, 30d supply, fill #0
  Filled 2024-02-07: qty 30, 30d supply, fill #1

## 2023-09-28 MED ORDER — PHENTERMINE HCL 37.5 MG PO TABS
37.5000 mg | ORAL_TABLET | Freq: Every day | ORAL | 0 refills | Status: DC
  Filled 2023-09-28: qty 30, 30d supply, fill #0

## 2023-10-03 ENCOUNTER — Other Ambulatory Visit: Payer: Self-pay

## 2023-10-03 MED ORDER — DOXYCYCLINE HYCLATE 100 MG PO TABS
100.0000 mg | ORAL_TABLET | Freq: Every day | ORAL | 0 refills | Status: AC
  Filled 2023-10-03: qty 60, 60d supply, fill #0

## 2023-10-03 MED ORDER — FLUCONAZOLE 150 MG PO TABS
150.0000 mg | ORAL_TABLET | Freq: Once | ORAL | 0 refills | Status: AC
  Filled 2023-10-03: qty 1, 1d supply, fill #0

## 2023-10-03 MED ORDER — LAMOTRIGINE 25 MG PO TABS
50.0000 mg | ORAL_TABLET | Freq: Two times a day (BID) | ORAL | 0 refills | Status: DC
  Filled 2023-10-05: qty 120, 30d supply, fill #0

## 2023-10-05 ENCOUNTER — Other Ambulatory Visit: Payer: Self-pay

## 2023-10-06 ENCOUNTER — Other Ambulatory Visit: Payer: Self-pay

## 2023-10-06 MED ORDER — BUSPIRONE HCL 15 MG PO TABS
ORAL_TABLET | ORAL | 0 refills | Status: AC
  Filled 2023-10-06: qty 225, 90d supply, fill #0

## 2023-10-06 MED ORDER — BUPROPION HCL ER (XL) 150 MG PO TB24
150.0000 mg | ORAL_TABLET | Freq: Every morning | ORAL | 0 refills | Status: DC
  Filled 2023-10-06: qty 90, 90d supply, fill #0

## 2023-10-06 MED ORDER — LORAZEPAM 1 MG PO TABS
1.0000 mg | ORAL_TABLET | Freq: Two times a day (BID) | ORAL | 0 refills | Status: DC
  Filled 2023-10-23: qty 60, 30d supply, fill #0

## 2023-10-06 MED ORDER — BUPROPION HCL ER (XL) 300 MG PO TB24
300.0000 mg | ORAL_TABLET | Freq: Every day | ORAL | 0 refills | Status: DC
  Filled 2023-10-06 – 2023-11-20 (×2): qty 90, 90d supply, fill #0

## 2023-10-10 ENCOUNTER — Ambulatory Visit: Admitting: Gastroenterology

## 2023-10-17 ENCOUNTER — Other Ambulatory Visit: Payer: Self-pay

## 2023-10-17 ENCOUNTER — Ambulatory Visit: Admitting: Physician Assistant

## 2023-10-17 ENCOUNTER — Other Ambulatory Visit: Payer: Self-pay | Admitting: Gastroenterology

## 2023-10-17 DIAGNOSIS — R110 Nausea: Secondary | ICD-10-CM

## 2023-10-20 ENCOUNTER — Ambulatory Visit: Admitting: Gastroenterology

## 2023-10-20 ENCOUNTER — Encounter: Payer: Self-pay | Admitting: Gastroenterology

## 2023-10-20 ENCOUNTER — Ambulatory Visit: Payer: Self-pay | Admitting: Gastroenterology

## 2023-10-20 ENCOUNTER — Other Ambulatory Visit (INDEPENDENT_AMBULATORY_CARE_PROVIDER_SITE_OTHER)

## 2023-10-20 VITALS — BP 128/90 | HR 122 | Ht 63.0 in | Wt 170.4 lb

## 2023-10-20 DIAGNOSIS — R7401 Elevation of levels of liver transaminase levels: Secondary | ICD-10-CM | POA: Diagnosis not present

## 2023-10-20 DIAGNOSIS — K5900 Constipation, unspecified: Secondary | ICD-10-CM | POA: Diagnosis not present

## 2023-10-20 DIAGNOSIS — R140 Abdominal distension (gaseous): Secondary | ICD-10-CM | POA: Diagnosis not present

## 2023-10-20 DIAGNOSIS — K219 Gastro-esophageal reflux disease without esophagitis: Secondary | ICD-10-CM

## 2023-10-20 DIAGNOSIS — R6881 Early satiety: Secondary | ICD-10-CM | POA: Diagnosis not present

## 2023-10-20 DIAGNOSIS — R110 Nausea: Secondary | ICD-10-CM

## 2023-10-20 DIAGNOSIS — R7989 Other specified abnormal findings of blood chemistry: Secondary | ICD-10-CM

## 2023-10-20 LAB — HEPATIC FUNCTION PANEL
ALT: 25 U/L (ref 0–35)
AST: 16 U/L (ref 0–37)
Albumin: 4.6 g/dL (ref 3.5–5.2)
Alkaline Phosphatase: 41 U/L (ref 39–117)
Bilirubin, Direct: 0.1 mg/dL (ref 0.0–0.3)
Total Bilirubin: 0.3 mg/dL (ref 0.2–1.2)
Total Protein: 8.1 g/dL (ref 6.0–8.3)

## 2023-10-20 MED ORDER — IBGARD 90 MG PO CPCR: ORAL_CAPSULE | ORAL | Status: AC

## 2023-10-20 MED ORDER — CITRUCEL PO POWD: ORAL | Status: AC

## 2023-10-20 NOTE — Patient Instructions (Addendum)
 Please go to the lab in the basement of our building to have lab work done as you leave today. Hit B for basement when you get on the elevator.  When the doors open the lab is on your left.  We will call you with the results. Thank you.  You have been scheduled for a gastric emptying scan at [facility] on Friday, 11-11-23. Please arrive at 9:00am for registration. Please make certain not to have anything to eat or drink after midnight the night before your test. Hold all stomach medications (ex: Zofran , phenergan , Reglan ) 24 hours prior to your test. If you need to reschedule your appointment, please contact radiology scheduling at [phone]. _____________________________________________________________________ A gastric-emptying study measures how long it takes for food to move through your stomach. There are several ways to measure stomach emptying. In the most common test, you eat food that contains a small amount of radioactive material. A scanner that detects the movement of the radioactive material is placed over your abdomen to monitor the rate at which food leaves your stomach. This test normally takes about 4 hours to complete. _____________________________________________________________________   Stop Metamucil and Bentyl   Please purchase the following medications over the counter and take as directed: Citrucel - use as directed every other day and then every day as tolerated  We have given you samples of the following medication to take: IBgard - use as directed  We are referring you to [facility].  They will contact you directly to schedule an appointment.  It may take a week or more before you hear from them.  Please feel free to contact us  if you have not heard from them within 2 weeks and we will follow up on the referral.  Continue Voquezna  and Zofran   Please discuss Lamictal  with your psychologist.   Thank you for entrusting me with your care and  for choosing [facility], Dr. Elspeth Naval    _______________________________________________________  If your blood pressure at your visit was 140/90 or greater, please contact your primary care physician to follow up on this.  _______________________________________________________  If you are age 74 or older, your body mass index should be between 23-30. Your Body mass index is 30.18 kg/m. If this is out of the aforementioned range listed, please consider follow up with your Primary Care Provider.  If you are age 65 or younger, your body mass index should be between 19-25. Your Body mass index is 30.18 kg/m. If this is out of the aformentioned range listed, please consider follow up with your Primary Care Provider.   ________________________________________________________  The [facility] providers would like to encourage you to use MYCHART to communicate with providers for non-urgent requests or questions.  Due to long hold times on the telephone, sending your provider a message by [facility] may be a faster and more efficient way to get a response.  Please allow 48 business hours for a response.  Please remember that this is for non-urgent requests.  _______________________________________________________  [facility] is using a team-based approach to care.  Your team is made up of your doctor and two to three APPS. Our APPS (Nurse Practitioners and Physician Assistants) work with your physician to ensure care continuity for you. They are fully qualified to address your health concerns and develop a treatment plan. They communicate directly with your gastroenterologist to care for you. Seeing the Advanced Practice Practitioners on your physician's team can help you by facilitating care more promptly, often allowing  for earlier appointments, access to diagnostic testing, procedures, and other specialty referrals.

## 2023-10-20 NOTE — Progress Notes (Signed)
 HPI :  23 year old female here for follow-up visit for multiple GI complaints today.  Recall she has had a history of reflux, bloating, abdominal pain, altered bowel habits, colon polyps.  She has multiple issues she would like to discuss today.  Recall she has failed Pepcid , omeprazole , Protonix in the past for her GERD.  Most recently she has been on Voquenza 10 mg daily and she does think that has helped more than anything else she has been on for reflux in the past currently doing better in that regard.  She still has nausea every morning and this bothers her most days.  This been going on for quite a bit of time at this point.  She takes Zofran  anywhere from once to 3 times daily to control it and she states it does help when she takes it.  She has Phenergan  as a backup for emergencies but does not really take that much at all.  She does have some early satiety, does not typically vomit.  Has significant bloating in her abdomen that continues to bother her.  She states she is been on Lamictal  75 mg/day for the past 6 months or so.  She is also been on a newer oral contraceptive for the past 6 months or so and reportedly will be stopping that soon.  She denies any other changes to her medications recently.  Her bowels have continued to bother her.  She had a colonoscopy with me for history of polyps, recall she has had an advanced sessile serrated polyp removed in 2023.  She had a repeat colonoscopy with me just this past July which showed a small sessile serrated polyp but nothing concerning otherwise.  She has had worsening constipation since have seen her.  The problem she is having is that she has significant bloating and discomfort when she is not moving her bowels.  However when she does move her bowels, she feels she has sense of incomplete evacuation and it takes quite a long time to clean herself, impairing her ability to work.  She tried MiraLAX  which she states made it harder to clean and she  was not happy with that.  She has been on Metamucil for recent weeks and she states she takes about once a week and it does help her move her bowels more but she has been trying not to move her bowels in light of the issue with cleaning herself.  She is tearful when she describes talking about this, she wonders if she has a problem with her pelvic muscles and her pelvic floor.  She does take Bentyl  as needed which can help with her cramping but she states it can make her constipation worse.  She may have tried IBgard in the past but sounds like it was a while ago.  She was given a course of rifaximin  for bloating in recent months but states she did not take it as the pharmacist said it could make her constipation worse and she was scared of that happening.  She has had extensive evaluation as outlined below for symptoms in the past.  She has had an EGD before as well as right upper quadrant ultrasound, CT scan abdomen pelvis, HIDA scan all in recent months without concerning pathology.  She did go to the ED in May for abdominal pain and nausea.  She did have an elevated ALT to the 60s.  This was repeated a few weeks later and was in the 70s, last checked in  June.  She had a serologic workup for cause of this and did not reveal any clear etiology.  Recall she has otherwise had negative testing for alpha gal and celiac disease.    07/18/2023 CT abdomen pelvis with contrast: No acute abnormality.  Normal.   07/18/2023 RUQ ultrasound: No acute abnormality.  Normal gallbladder.  No gallstones.  CBD 3 mm.  Normal liver.    07/2021 EGD: Normal.  Biopsies showed chronic gastritis.  Biopsies negative for H. pylori and celiac.   07/2021 colonoscopy: 20 to 25 mm sessile serrated polyp removed from ascending colon.  No dysplasia.  Internal hemorrhoids.  Otherwise normal.  Biopsies negative for microscopic colitis and IBD.  1 year repeat colonoscopy was recommended, which is overdue.   Colonoscopy 08/08/23: - The  perianal and digital rectal examinations were normal. - A tattoo was seen in the ascending colon. No evidence of the previous polyp, site looked good. - A 5 to 6 mm polyp was found in the ascending colon. The polyp was flat. The polyp was removed with a cold snare. Resection and retrieval were complete. - The exam was otherwise without abnormality.  FINAL DIAGNOSIS        1. Surgical [P], colon, ascending polyp x 1, polyp (1) :       SESSILE SERRATED POLYP WITHOUT CYTOLOGIC DYSPLASIA   Repeat in 3 years given age  HIDA 08/22/23: IMPRESSION: Exam is within normal limits.  Past Medical History:  Diagnosis Date   Anemia    Anxiety    Bipolar depression (HCC)    Borderline personality disorder (HCC)    Depression    GERD (gastroesophageal reflux disease)    Hypertension    IBS (irritable bowel syndrome)    PCOS (polycystic ovarian syndrome)    Post traumatic stress disorder (PTSD)      Past Surgical History:  Procedure Laterality Date   None     Family History  Problem Relation Age of Onset   Mental illness Mother    Cancer Mother        ovary   Endometriosis Mother    Depression Father    Irritable bowel syndrome Father    COPD Maternal Grandmother    Cancer Maternal Grandmother        ovary   Colon cancer Neg Hx    Rectal cancer Neg Hx    Esophageal cancer Neg Hx    Stomach cancer Neg Hx    Social History   Tobacco Use   Smoking status: Never   Smokeless tobacco: Never  Vaping Use   Vaping status: Never Used  Substance Use Topics   Alcohol use: Yes    Comment: once monthly   Drug use: Not Currently    Frequency: 1.0 times per week    Types: Marijuana   Current Outpatient Medications  Medication Sig Dispense Refill   AMBULATORY NON FORMULARY MEDICATION Medication Name: Diltiazem 2%/Lidocaine  5% -Apply a small amount to the rectum three times daily as needed 30 g 0   buPROPion  (WELLBUTRIN  XL) 300 MG 24 hr tablet Take 1 tablet (300 mg total) by mouth daily.  90 tablet 0   busPIRone  (BUSPAR ) 15 MG tablet Take 1 tablet (15 mg total) by mouth in the morning AND 1.5 tablets (22.5 mg total) Nightly. 225 tablet 0   clindamycin  (CLEOCIN  T) 1 % external solution Apply 1 Application topically 2 (two) times daily. 60 mL 2   dicyclomine  (BENTYL ) 20 MG tablet Take 1 tablet (20 mg  total) by mouth every 8 (eight) hours as needed. 30 tablet 3   doxycycline  (VIBRA -TABS) 100 MG tablet Take 1 tablet (100 mg total) by mouth daily. 60 tablet 0   drospirenone -ethinyl estradiol  (YASMIN ) 3-0.03 MG tablet Take 1 tablet by mouth daily. 84 tablet 1   lamoTRIgine  (LAMICTAL ) 25 MG tablet Take 2 tablets (50 mg total) by mouth 2 (two) times daily. 120 tablet 0   LORazepam  (ATIVAN ) 1 MG tablet Take 1 tablet (1 mg total) by mouth 2 (two) times daily. 60 tablet 0   ondansetron  (ZOFRAN ) 4 MG tablet Take 1 tablet (4 mg total) by mouth 3 (three) times daily as needed. PLEASE KEEP 06/2023 APPOINTMENT 30 tablet 2   phentermine  (ADIPEX-P ) 37.5 MG tablet Take 1 tablet (37.5 mg total) by mouth daily before breakfast. 30 tablet 1   promethazine  (PHENERGAN ) 25 MG tablet Take 1 tablet (25 mg total) by mouth every 6 (six) hours as needed. 15 tablet 0   rifaximin  (XIFAXAN ) 550 MG TABS tablet Take 1 tablet (550 mg total) by mouth 3 (three) times daily. 42 tablet 0   scopolamine  (TRANSDERM-SCOP) 1 MG/3DAYS Place 1 patch (1 mg total) onto the skin every three (3) days as needed. 3 patch 0   Vonoprazan Fumarate  (VOQUEZNA ) 10 MG TABS Take 10 mg by mouth daily. 30 tablet 5   No current facility-administered medications for this visit.   Allergies  Allergen Reactions   Haldol [Haloperidol] Other (See Comments)    Severe akathisia   Reglan  [Metoclopramide ] Other (See Comments)    Mild akathisia, restlessness   Tramadol  Other (See Comments)    Mild akathisia, restlessness   Other Other (See Comments)    Pt cannot remember which medicine makes her itch     Review of Systems: All systems reviewed  and negative except where noted in HPI.   Lab Results  Component Value Date   WBC 6.7 07/25/2023   HGB 14.6 07/25/2023   HCT 43.0 07/25/2023   MCV 79.4 07/25/2023   PLT 278.0 07/25/2023    Lab Results  Component Value Date   NA 135 07/17/2023   CL 107 07/17/2023   K 3.7 07/17/2023   CO2 19 (L) 07/17/2023   BUN 12 07/17/2023   CREATININE 0.74 07/17/2023   GFRNONAA >60 07/17/2023   CALCIUM 9.4 07/17/2023   ALBUMIN 4.5 07/25/2023   GLUCOSE 124 (H) 07/17/2023    Lab Results  Component Value Date   ALT 71 (H) 07/25/2023   AST 26 07/25/2023   ALKPHOS 44 07/25/2023   BILITOT 0.3 07/25/2023     Physical Exam: BP (!) 128/90   Pulse (!) 122   Ht 5' 3 (1.6 m)   Wt 170 lb 6 oz (77.3 kg)   BMI 30.18 kg/m  Constitutional: Pleasant,well-developed, female in no acute distress. Neurological: Alert and oriented to person place and time. Psychiatric: Normal mood and affect. Behavior is normal.   ASSESSMENT: 23 y.o. female here for assessment of the following  1. Constipation, unspecified constipation type   2. Bloating   3. Gastroesophageal reflux disease, unspecified whether esophagitis present   4. Early satiety   5. Chronic nausea   6. Elevated ALT measurement    Discussed each of these issues for a bit today.  Currently having constipation which is making her bloating worse.  She is fearful of using the bathroom more frequently due to this issue of sense of incomplete evacuation and trying to clean herself excessively.  Discussed with her she  has to move her bowels more frequently to relieve her bloating and abdominal discomfort.  MiraLAX  made it harder for herself to clean.  She is doing better on Metamucil however counseled her this can really make bloating worse and recommend she stop the Metamucil and switch to Citrucel which I think would be better tolerated.  Recommend she take this at least every other day.  She inquires about pelvic floor dysfunction which is  certainly possible.  I offered to do DRE today to evaluate for that and she does not wish to have a rectal exam.  I offered her an empiric referral to pelvic floor PT given her chronic symptoms and she wanted to pursue that.  Otherwise, I recommend she stop Bentyl , as that is probably more constipating for her and use IBgard as needed for bloating and pain.  I do think her bloating should be better if she comes off Metamucil and moves her bowels more frequently.  In regards to her upper tract symptoms, we Voquenza is doing much better job controlling her reflux symptoms but still quite nauseated and with early satiety.  Imaging is reassuring.  Will refer for gastric emptying study to assess for gastroparesis, she wants to proceed with that.  Otherwise continue antiemetics.  In regards to her chronic nausea, she does take Lamictal  which can be associated with nausea.  I recommend she discuss this further with her psychiatrist to see if they want to use an alternative regimen, if potentially contributing or causing her nausea.  Finally she has had an elevated ALT that is mild in recent months.  Serologic workup negative, imaging without clear cause.  Will repeat LFTs now to trend, further recommendation pending that result.  She agrees to the plan as outlined.   PLAN: - stop metamucil - start Citrucel - titrate up slowly -  - stop bentyl  - start IB gard to use PRN - samples given - refer to pelvic floor PT for possible pelvic floor dysfunction - refer for gastric emptying study - rule out gastroparesis - I asked her to speak with her psychiatrist about lamictal  (possible cause of nausea) - continue Zofran  PRN - continue Voquezna  10mg  , she has failed multiple prior PPIs and is doing better on this - LFTs today - prior workup negative. Perhaps med related? No steatosis on imaging, will trend for now  40 minutes spent reviewing chart, time with patient, and documenting.  Marcey Naval,  MD [facility]

## 2023-10-21 ENCOUNTER — Other Ambulatory Visit: Payer: Self-pay

## 2023-10-21 ENCOUNTER — Other Ambulatory Visit: Payer: Self-pay | Admitting: Physician Assistant

## 2023-10-21 DIAGNOSIS — R110 Nausea: Secondary | ICD-10-CM

## 2023-10-21 MED ORDER — ONDANSETRON HCL 4 MG PO TABS
4.0000 mg | ORAL_TABLET | ORAL | 2 refills | Status: DC | PRN
  Filled 2023-10-21: qty 30, 10d supply, fill #0
  Filled 2023-11-17: qty 30, 10d supply, fill #1
  Filled 2023-12-11: qty 30, 10d supply, fill #2

## 2023-10-24 ENCOUNTER — Other Ambulatory Visit: Payer: Self-pay

## 2023-10-24 ENCOUNTER — Other Ambulatory Visit: Payer: Self-pay | Admitting: Gastroenterology

## 2023-10-24 MED ORDER — VOQUEZNA 10 MG PO TABS
10.0000 mg | ORAL_TABLET | Freq: Every day | ORAL | 5 refills | Status: DC
  Filled 2023-10-24: qty 30, 30d supply, fill #0

## 2023-10-25 ENCOUNTER — Other Ambulatory Visit: Payer: Self-pay

## 2023-10-25 ENCOUNTER — Telehealth: Payer: Self-pay

## 2023-10-25 NOTE — Telephone Encounter (Signed)
 Pharmacy Patient Advocate Encounter   Received notification from CoverMyMeds that prior authorization for Voquezna  10MG  tablets is required/requested.   Insurance verification completed.   The patient is insured through [facility] .   Per test claim: PA required; PA submitted to above mentioned insurance via Latent Key/confirmation #/EOC [id number] Status is pending

## 2023-10-27 ENCOUNTER — Other Ambulatory Visit: Payer: Self-pay

## 2023-10-27 LAB — LAB REPORT - SCANNED: EGFR: 118

## 2023-10-28 ENCOUNTER — Other Ambulatory Visit: Payer: Self-pay

## 2023-10-28 NOTE — Telephone Encounter (Signed)
 Pharmacy Patient Advocate Encounter  Received notification from [employer] that Prior Authorization for Voquezna  10MG  tablets has been DENIED.  Full denial letter will be uploaded to the media tab. See denial reason below.  Per your health plan's criteria, this drug is covered if you meet the following:  Your doctor provices medical records (for example: chart notes) showing one of the following:  A. You have tried or cannot use (for a minimum 8-week supply) and did not get better (in the last 365 days) with two of these generic proton pump inhibitors (medicines that lower stomach acid): I. Omeprazole  II. Esomeprazole III. Pantoprazole IV. Lansoprazole V. Rabeprazole VI Dexlansoprazole  B. You have tried or cannot use all of these generic proton pump inhibitors (medicines that lower stomach acid): I. Omeprazole  II. Esomeprazole III. Pantoprazole IV. Lansoprazole V. Rabeprazole VI. Dexlansoprazole  PA #/Case ID/Reference #: [id number]

## 2023-10-31 MED ORDER — VOQUEZNA 10 MG PO TABS: 10.0000 mg | ORAL_TABLET | Freq: Every day | ORAL | 5 refills | Status: DC

## 2023-10-31 NOTE — Telephone Encounter (Signed)
 Rx sent to [facility] instead. Will see if they are able to provide patient rx with their program.

## 2023-11-08 ENCOUNTER — Other Ambulatory Visit: Payer: Self-pay

## 2023-11-10 ENCOUNTER — Ambulatory Visit: Attending: Internal Medicine | Admitting: Internal Medicine

## 2023-11-10 ENCOUNTER — Other Ambulatory Visit: Payer: Self-pay

## 2023-11-10 ENCOUNTER — Encounter: Payer: Self-pay | Admitting: Internal Medicine

## 2023-11-10 VITALS — BP 133/80 | HR 119 | Temp 97.5°F | Resp 16 | Ht 64.0 in | Wt 168.0 lb

## 2023-11-10 DIAGNOSIS — K582 Mixed irritable bowel syndrome: Secondary | ICD-10-CM | POA: Diagnosis not present

## 2023-11-10 DIAGNOSIS — M359 Systemic involvement of connective tissue, unspecified: Secondary | ICD-10-CM | POA: Insufficient documentation

## 2023-11-10 DIAGNOSIS — R5382 Chronic fatigue, unspecified: Secondary | ICD-10-CM

## 2023-11-10 DIAGNOSIS — E282 Polycystic ovarian syndrome: Secondary | ICD-10-CM | POA: Insufficient documentation

## 2023-11-10 DIAGNOSIS — F4312 Post-traumatic stress disorder, chronic: Secondary | ICD-10-CM | POA: Insufficient documentation

## 2023-11-10 DIAGNOSIS — M797 Fibromyalgia: Secondary | ICD-10-CM | POA: Diagnosis not present

## 2023-11-10 DIAGNOSIS — R7303 Prediabetes: Secondary | ICD-10-CM | POA: Insufficient documentation

## 2023-11-10 DIAGNOSIS — R7689 Other specified abnormal immunological findings in serum: Secondary | ICD-10-CM | POA: Diagnosis not present

## 2023-11-10 DIAGNOSIS — F603 Borderline personality disorder: Secondary | ICD-10-CM | POA: Insufficient documentation

## 2023-11-10 MED ORDER — CYCLOBENZAPRINE HCL 10 MG PO TABS
10.0000 mg | ORAL_TABLET | Freq: Every evening | ORAL | 0 refills | Status: DC | PRN
  Filled 2023-11-10: qty 30, 30d supply, fill #0

## 2023-11-10 NOTE — Patient Instructions (Signed)
 I recommend checking out the [place] [facility] [place] patient-centered guide for fibromyalgia and chronic pain management: [email or web address]

## 2023-11-10 NOTE — Progress Notes (Signed)
 Office Visit Note  Patient: Cynthia Ross             Date of Birth: [birth year]           MRN: [id number]             PCP: Cleotilde, [place]  E, PA Referring: [place], PA-C Visit Date: 11/10/2023 Occupation: Data Unavailable  Subjective:  New Patient (Initial Visit) (Pt has a list of things to discuss )   Discussed the use of AI scribe software for clinical note transcription with the patient, who gave verbal consent to proceed.  History of Present Illness   Cynthia Ross is a 23 year old female who presents with fatigue, joint pain, and systemic symptoms. She was referred by the GI clinic due to a positive ANA lab test also found during their evaluation of apparent IBS.  She has a long-standing history of fatigue and pain described as 'sore all over,' with pain migrating to different parts of her body. The pain disrupts her sleep and daily activities, and she sometimes requires assistance from her boyfriend for tasks like dressing. These symptoms have been present since high school, and she reports that they have gotten worse over the past year.  In May of this year, she became 'really, really sick,' experiencing abdominal pain, fatigue, and cognitive difficulties; she has a history of IBS. These symptoms persisted, leading to a period of improvement in September, followed by a relapse after a cruise. She describes episodes of feeling very tired, weak, and having difficulty performing daily activities, such as changing clothes or showering.  She experiences hot flashes, always feels hot, and noted a resting heart rate of 102 bpm, which she attributes to lamotrigine  use. She has been tapering off this medication, noting some improvement in heart rate. She also experiences episodes of skin flushing, blurred vision, shaking, and confusion; she reports that these episodes have become more frequent in the past few weeks.  She has experienced hair loss when she was sick and has noted a  facial rash that appears when she feels particularly unwell. She has lost a lot of weight this year and was going to the gym, but has since been unable to maintain her exercise routine due to fatigue and weakness.  She has a chronic history of IBS, which she reports has been worse this year, and her previous colonoscopy showed no significant inflammatory changes. She occasionally takes Tylenol  for pain relief, particularly when it interferes with her sleep or work.  She experiences occasional swelling in her legs and feet but does not consistently monitor this. No significant family history of autoimmune or inflammatory conditions is known, as she is not in contact with her parents and the rest of her family is deceased.  She describes episodes of mottled skin on her legs, particularly after exposure to temperature changes, and notes that her memory has been poor for years, worsening over the past two years.        Labs reviewed 06/2023 ANA 1:40 speckled AMA neg ASMA neg Iron  wnl  08/2022 IDA  04/2022 ANA neg RF neg ESR wnl CRP wnl  Activities of Daily Living:  Patient reports morning stiffness for 2-3 hours.   Patient Reports nocturnal pain.  Difficulty dressing/grooming: Reports Difficulty climbing stairs: Reports Difficulty getting out of chair: Reports Difficulty using hands for taps, buttons, cutlery, and/or writing: Reports  Review of Systems  Constitutional:  Positive for fatigue.  HENT:  Positive for mouth dryness.  Negative for mouth sores.   Eyes:  Positive for dryness.  Respiratory:  Positive for shortness of breath.   Cardiovascular:  Positive for chest pain and palpitations.  Gastrointestinal:  Positive for blood in stool, constipation and diarrhea.  Endocrine: Positive for increased urination.  Genitourinary:  Negative for involuntary urination.  Musculoskeletal:  Positive for joint pain, gait problem, joint pain, joint swelling, myalgias, muscle weakness, morning  stiffness, muscle tenderness and myalgias.  Skin:  Positive for color change, rash, hair loss and sensitivity to sunlight.  Allergic/Immunologic: Positive for susceptible to infections.  Neurological:  Positive for dizziness and headaches.  Hematological:  Positive for swollen glands.  Psychiatric/Behavioral:  Positive for depressed mood and sleep disturbance. The patient is nervous/anxious.     PMFS History:  Patient Active Problem List   Diagnosis Date Noted   Positive ANA (antinuclear antibody) 11/10/2023   Chronic post-traumatic stress disorder (PTSD) 11/10/2023   Borderline personality disorder (HCC) 11/10/2023   PCOS (polycystic ovarian syndrome) 11/10/2023   Prediabetes 11/10/2023   IDA (iron  deficiency anemia) 09/21/2022   Fatigue 09/21/2022   Abdominal pain of multiple sites 03/11/2017   Irritable bowel syndrome with both constipation and diarrhea 12/14/2016   Depression 09/22/2012   Generalized anxiety disorder 09/22/2012   Family history of bipolar disorder 09/22/2012   Family history of depression 09/22/2012   Dyspepsia 09/08/2012   GERD (gastroesophageal reflux disease) 09/08/2012    Past Medical History:  Diagnosis Date   Anemia    Anxiety    Bipolar depression (HCC)    Borderline personality disorder (HCC)    Depression    GERD (gastroesophageal reflux disease)    Hidradenitis suppurativa    Hypertension    IBS (irritable bowel syndrome)    PCOS (polycystic ovarian syndrome)    Post traumatic stress disorder (PTSD)     Family History  Problem Relation Age of Onset   Mental illness Mother    Cancer Mother        ovary   Endometriosis Mother    Depression Father    Irritable bowel syndrome Father    Healthy Brother    Healthy Brother    COPD Maternal Grandmother    Cancer Maternal Grandmother        ovary   Colon cancer Neg Hx    Rectal cancer Neg Hx    Esophageal cancer Neg Hx    Stomach cancer Neg Hx    Past Surgical History:  Procedure  Laterality Date   COLONOSCOPY  08/2023   x2   UPPER GI ENDOSCOPY  2023   Social History   Tobacco Use   Smoking status: Never    Passive exposure: Past   Smokeless tobacco: Never  Vaping Use   Vaping status: Never Used  Substance Use Topics   Alcohol use: Yes    Comment: social   Drug use: Not Currently    Frequency: 1.0 times per week    Types: Marijuana   Social History   Social History Narrative   Not on file     Immunization History  Administered Date(s) Administered   DTaP 05/27/1998, 07/28/1998, 10/06/1998, 09/29/1999   H1N1 12/06/2007   HIB (PRP-OMP) 05/27/1998, 07/28/1998, 10/06/1998, 09/29/1999   Hepatitis B 05/27/1998, 07/28/1998, 10/06/1998   IPV 05/27/1998, 07/28/1998, 09/29/1999   Influenza Nasal 11/21/2007, 10/24/2008   Influenza Whole 10/23/2009, 12/07/2010, 11/15/2011   Influenza, Seasonal, Injecte, Preservative Fre 11/14/2012, 10/21/2023   MMR 03/31/1999   Pneumococcal Conjugate-13 03/31/1999   Tdap 09/22/2009  Varicella 03/31/1999     Objective: Vital Signs: BP 133/80 (BP Location: Left Arm, Patient Position: Sitting, Cuff Size: Normal)   Pulse (!) 119   Temp (!) 97.5 F (36.4 C)   Resp 16   Ht 5' 4 (1.626 m)   Wt 168 lb (76.2 kg)   LMP 10/13/2023   BMI 28.84 kg/m    Physical Exam Eyes:     Conjunctiva/sclera: Conjunctivae normal.  Cardiovascular:     Rate and Rhythm: Regular rhythm. Tachycardia present.  Pulmonary:     Effort: Pulmonary effort is normal.     Breath sounds: Normal breath sounds.  Lymphadenopathy:     Cervical: No cervical adenopathy.  Skin:    General: Skin is warm and dry.     Comments: Normal-appearing nailfold capillaroscopy  Neurological:     Mental Status: She is alert.  Psychiatric:        Mood and Affect: Mood normal.      Musculoskeletal Exam:  Neck full ROM no tenderness Shoulders full ROM no tenderness or swelling Elbows full ROM no tenderness or swelling Wrists full ROM no tenderness or  swelling Fingers full ROM no tenderness or swelling Back pain tenderness to pressure across multiple areas midline and paraspinal muscles, palpable muscle knots, no radiation Hip normal internal and external rotation without pain, no tenderness to lateral hip palpation Knees full ROM no tenderness or swelling Ankles full ROM no tenderness or swelling   Investigation: No additional findings.  Imaging: No results found.  Recent Labs: Lab Results  Component Value Date   WBC 6.7 07/25/2023   HGB 14.6 07/25/2023   PLT 278.0 07/25/2023   NA 135 07/17/2023   K 3.7 07/17/2023   CL 107 07/17/2023   CO2 19 (L) 07/17/2023   GLUCOSE 124 (H) 07/17/2023   BUN 12 07/17/2023   CREATININE 0.74 07/17/2023   BILITOT 0.3 10/20/2023   ALKPHOS 41 10/20/2023   AST 16 10/20/2023   ALT 25 10/20/2023   PROT 8.1 10/20/2023   ALBUMIN 4.6 10/20/2023   CALCIUM 9.4 07/17/2023   GFRAA 148 01/11/2017    Speciality Comments: No specialty comments available.  Procedures:  No procedures performed Allergies: Haldol [haloperidol], Reglan  [metoclopramide ], Tramadol , and Other   Assessment / Plan:     Visit Diagnoses: Positive ANA (antinuclear antibody) - Plan: cyclobenzaprine (FLEXERIL) 10 MG tablet, C3 and C4, Anti-DNA antibody, double-stranded, RNP Antibody, Anti-Smith antibody, Sjogrens syndrome-A extractable nuclear antibody, Anti-scleroderma antibody, ANA, Sedimentation rate Positive ANA with multisystem symptoms including fatigue, joint pain, cognitive impairment, and skin changes. Differential diagnosis includes autoimmune diseases such as lupus or rheumatoid arthritis, thyroid  disease, and other autoimmune conditions.  However she does not really have specific clinical criteria evident from workup or examination at this time and with the very low titer would have lower suspicion for true systemic connective tissue disease related to this. - Order additional blood tests to identify specific antibody  markers associated with autoimmune diseases. - Consider follow-up in 6 months if no specific diagnosis is made. - Re-evaluate if symptoms worsen or new symptoms develop.  Fibromyalgia syndrome - Plan: cyclobenzaprine (FLEXERIL) 10 MG tablet Chronic widespread pain, fatigue, cognitive impairment, and irritable bowel syndrome consistent with fibromyalgia syndrome. Symptoms worsening. Diagnosis of exclusion, may coexist with other conditions. Primarily a nerve disorder. - Provide resources for fibromyalgia management, including the [employer]  Department of Rheumatology patient guide. - Discuss symptom management strategies. - Agree with the recommendation with Cymbalta  currently at 30 mg daily  with her GI Dr. Leigh  Autonomic dysfunction Episodic tachycardia, facial flushing, mottled skin, and lower extremity swelling suggest autonomic dysfunction. Symptoms may relate to autonomic dysregulation or vasomotor problems. Differential diagnosis includes mast cell activation syndrome and other inflammatory diseases.  Myofascial back pain Chronic myofascial back pain with muscle tension and knots. Pain likely due to overly tense muscles rather than spinal issues. Symptoms include stiffness and pain, particularly at night, affecting sleep quality. - Prescribe Flexeril (cyclobenzaprine) 10 mg at night as needed for muscle relaxation and to improve sleep quality.    Orders: Orders Placed This Encounter  Procedures   C3 and C4   Anti-DNA antibody, double-stranded   RNP Antibody   Anti-Smith antibody   Sjogrens syndrome-A extractable nuclear antibody   Anti-scleroderma antibody   ANA   Sedimentation rate   Anti-nuclear ab-titer (ANA titer)   Meds ordered this encounter  Medications   cyclobenzaprine (FLEXERIL) 10 MG tablet    Sig: Take 1 tablet (10 mg total) by mouth at bedtime as needed.    Dispense:  30 tablet    Refill:  0    Follow-Up Instructions: Return in about 4  weeks (around 12/08/2023) for New pt +ANA f/u 58mo.   Lonni LELON Ester, MD  Note - This record has been created using [employer].  Chart creation errors have been sought, but may not always  have been located. Such creation errors do not reflect on  the standard of medical care.

## 2023-11-11 ENCOUNTER — Ambulatory Visit

## 2023-11-12 LAB — ANTI-NUCLEAR AB-TITER (ANA TITER): ANA Titer 1: 1:40 {titer} — ABNORMAL HIGH

## 2023-11-12 LAB — SJOGRENS SYNDROME-A EXTRACTABLE NUCLEAR ANTIBODY: SSA (Ro) (ENA) Antibody, IgG: <1 AI

## 2023-11-12 LAB — SEDIMENTATION RATE: Sed Rate: 6 mm/h (ref 0–20)

## 2023-11-12 LAB — ANA: Anti Nuclear Antibody (ANA): POSITIVE — AB

## 2023-11-12 LAB — ANTI-SCLERODERMA ANTIBODY: Scleroderma (Scl-70) (ENA) Antibody, IgG: <1 AI

## 2023-11-12 LAB — ANTI-DNA ANTIBODY, DOUBLE-STRANDED: ds DNA Ab: 3 [IU]/mL

## 2023-11-12 LAB — RNP ANTIBODY: Ribonucleic Protein(ENA) Antibody, IgG: <1 AI

## 2023-11-12 LAB — C3 AND C4
C3 Complement: 213 mg/dL — ABNORMAL HIGH (ref 83–193)
C4 Complement: 17 mg/dL (ref 15–57)

## 2023-11-12 LAB — ANTI-SMITH ANTIBODY: ENA SM Ab Ser-aCnc: <1 AI

## 2023-11-16 ENCOUNTER — Other Ambulatory Visit: Payer: Self-pay

## 2023-11-16 MED ORDER — PHENTERMINE HCL 37.5 MG PO TABS
37.5000 mg | ORAL_TABLET | Freq: Every day | ORAL | 0 refills | Status: AC
  Filled 2023-12-11: qty 30, 30d supply, fill #0

## 2023-11-17 ENCOUNTER — Other Ambulatory Visit: Payer: Self-pay

## 2023-11-17 ENCOUNTER — Other Ambulatory Visit (HOSPITAL_COMMUNITY): Payer: Self-pay

## 2023-11-18 ENCOUNTER — Encounter: Payer: Self-pay | Admitting: Gastroenterology

## 2023-11-18 ENCOUNTER — Other Ambulatory Visit: Payer: Self-pay

## 2023-11-18 MED ORDER — VOQUEZNA 10 MG PO TABS
10.0000 mg | ORAL_TABLET | Freq: Every day | ORAL | 5 refills | Status: DC
  Filled 2023-11-18 (×2): qty 30, 30d supply, fill #0

## 2023-11-20 ENCOUNTER — Other Ambulatory Visit: Payer: Self-pay

## 2023-11-21 ENCOUNTER — Other Ambulatory Visit: Payer: Self-pay

## 2023-11-22 ENCOUNTER — Other Ambulatory Visit: Payer: Self-pay

## 2023-11-23 ENCOUNTER — Other Ambulatory Visit: Payer: Self-pay

## 2023-11-24 ENCOUNTER — Other Ambulatory Visit: Payer: Self-pay

## 2023-11-24 ENCOUNTER — Encounter (HOSPITAL_COMMUNITY): Payer: Self-pay

## 2023-11-24 MED ORDER — VOQUEZNA 10 MG PO TABS
10.0000 mg | ORAL_TABLET | Freq: Every day | ORAL | 5 refills | Status: AC
  Filled 2023-11-24: qty 30, fill #0

## 2023-11-25 ENCOUNTER — Encounter: Payer: Self-pay | Admitting: Oncology

## 2023-11-25 ENCOUNTER — Other Ambulatory Visit

## 2023-11-25 ENCOUNTER — Other Ambulatory Visit (HOSPITAL_COMMUNITY): Payer: Self-pay

## 2023-11-25 ENCOUNTER — Other Ambulatory Visit: Payer: Self-pay

## 2023-11-25 ENCOUNTER — Telehealth: Payer: Self-pay | Admitting: Internal Medicine

## 2023-11-25 DIAGNOSIS — M797 Fibromyalgia: Secondary | ICD-10-CM

## 2023-11-25 MED ORDER — METHOCARBAMOL 500 MG PO TABS
500.0000 mg | ORAL_TABLET | Freq: Three times a day (TID) | ORAL | 0 refills | Status: AC | PRN
  Filled 2023-11-25: qty 60, 20d supply, fill #0

## 2023-11-25 NOTE — Telephone Encounter (Signed)
 Patient called again to see if Dr. Jeannetta has answered. Pt was advised he was still in clinic.

## 2023-11-25 NOTE — Telephone Encounter (Signed)
 Patient called stating Dr. Jeannetta wanted her to let him know when she was experiencing a flair.  Patient states she has pain all over her body, especially in her legs.  Patient states she is having difficulty walking and standing.  Patient also states her arms feel heavy.  Patient also has questions regarding her Flexeril medication.  Patient scheduled for Monday, 11/28/23 at 3:00 pm, but is also available to stop by the office this morning so Dr. Jeannetta can evaluate her when she is experiencing the flair.  Patient requested a return call.

## 2023-11-26 NOTE — Progress Notes (Signed)
 Office Visit Note  Patient: Cynthia Ross             Date of Birth: 2000/02/03           MRN: [id number]             PCP: Cleotilde, [place]  E, PA Referring: [place], [place]  E, PA Visit Date: 11/28/2023   Subjective:  Discussed the use of AI scribe software for clinical note transcription with the patient, who gave verbal consent to proceed.  History of Present Illness   Cynthia Ross is a 23 year old female who presents with joint pain, muscle weakness, and systemic symptoms.  She has been experiencing severe joint pain and muscle weakness since the middle of last week. The pain is persistent and tight, affecting her legs, hips, upper back, fingers, shoulder, and elbow. It sometimes has a burning sensation and is accompanied by muscle weakness, making her arms feel heavy and difficult to move. She recalls an episode where she felt unable to feed herself due to the heaviness in her arms.  She notes significant hair loss and the reappearance of canker sores, which she hasn't experienced since middle school. Additionally, she experienced severe abdominal pain that woke her up at 3 AM, initially fearing it was appendicitis. The pain subsided the following day without any associated diarrhea or constipation.  She mentions episodes of palpitations and chest pain, particularly at night, with her heart rate reaching up to 110 bpm. She also experiences numbness in her arms and hands. She has a history of anxiety, which sometimes makes it difficult for her to discern the severity of her symptoms.  She has been monitoring her blood pressure, noting elevated readings of 130/100 mmHg on Wednesday and Friday, accompanied by feelings of being hot and shaky. She has a history of IBS and has previously tried Cymbalta  for GI issues but could not tolerate it. She did not notice any diarrhea or constipation associated with her abdominal pain. Reports palpitations, dizziness, flushing, and chest pain.  Reports numbness in arms and hands.        Previous HPI 11/10/23 Cynthia Ross is a 23 year old female who presents with fatigue, joint pain, and systemic symptoms. She was referred by the GI clinic due to a positive ANA lab test also found during their evaluation of apparent IBS.   She has a long-standing history of fatigue and pain described as 'sore all over,' with pain migrating to different parts of her body. The pain disrupts her sleep and daily activities, and she sometimes requires assistance from her boyfriend for tasks like dressing. These symptoms have been present since high school, and she reports that they have gotten worse over the past year.   In May of this year, she became 'really, really sick,' experiencing abdominal pain, fatigue, and cognitive difficulties; she has a history of IBS. These symptoms persisted, leading to a period of improvement in September, followed by a relapse after a cruise. She describes episodes of feeling very tired, weak, and having difficulty performing daily activities, such as changing clothes or showering.   She experiences hot flashes, always feels hot, and noted a resting heart rate of 102 bpm, which she attributes to lamotrigine  use. She has been tapering off this medication, noting some improvement in heart rate. She also experiences episodes of skin flushing, blurred vision, shaking, and confusion; she reports that these episodes have become more frequent in the past few weeks.   She has  experienced hair loss when she was sick and has noted a facial rash that appears when she feels particularly unwell. She has lost a lot of weight this year and was going to the gym, but has since been unable to maintain her exercise routine due to fatigue and weakness.   She has a chronic history of IBS, which she reports has been worse this year, and her previous colonoscopy showed no significant inflammatory changes. She occasionally takes Tylenol  for pain  relief, particularly when it interferes with her sleep or work.   She experiences occasional swelling in her legs and feet but does not consistently monitor this. No significant family history of autoimmune or inflammatory conditions is known, as she is not in contact with her parents and the rest of her family is deceased.   She describes episodes of mottled skin on her legs, particularly after exposure to temperature changes, and notes that her memory has been poor for years, worsening over the past two years.         Labs reviewed 06/2023 ANA 1:40 speckled AMA neg ASMA neg Iron  wnl   08/2022 IDA   04/2022 ANA neg RF neg ESR wnl CRP wnl   Review of Systems  Constitutional:  Positive for fatigue.  HENT:  Positive for mouth sores and mouth dryness.   Eyes:  Positive for dryness.  Respiratory:  Positive for shortness of breath.   Cardiovascular:  Positive for chest pain and palpitations.  Gastrointestinal:  Positive for constipation. Negative for blood in stool and diarrhea.  Endocrine: Negative for increased urination.  Genitourinary:  Negative for involuntary urination.  Musculoskeletal:  Positive for joint pain, joint pain, joint swelling, myalgias, muscle weakness, morning stiffness, muscle tenderness and myalgias. Negative for gait problem.  Skin:  Positive for rash, hair loss and sensitivity to sunlight. Negative for color change.  Allergic/Immunologic: Positive for susceptible to infections.  Neurological:  Positive for dizziness and headaches.  Hematological:  Negative for swollen glands.  Psychiatric/Behavioral:  Positive for depressed mood and sleep disturbance. The patient is nervous/anxious.     PMFS History:  Patient Active Problem List   Diagnosis Date Noted   Fibromyalgia 11/28/2023   Positive ANA (antinuclear antibody) 11/10/2023   Chronic post-traumatic stress disorder (PTSD) 11/10/2023   Borderline personality disorder (HCC) 11/10/2023   PCOS (polycystic  ovarian syndrome) 11/10/2023   Prediabetes 11/10/2023   IDA (iron  deficiency anemia) 09/21/2022   Fatigue 09/21/2022   Abdominal pain of multiple sites 03/11/2017   Irritable bowel syndrome with both constipation and diarrhea 12/14/2016   Depression 09/22/2012   Generalized anxiety disorder 09/22/2012   Family history of bipolar disorder 09/22/2012   Family history of depression 09/22/2012   Dyspepsia 09/08/2012   GERD (gastroesophageal reflux disease) 09/08/2012    Past Medical History:  Diagnosis Date   Anemia    Anxiety    Bipolar depression (HCC)    Borderline personality disorder (HCC)    Depression    GERD (gastroesophageal reflux disease)    Hidradenitis suppurativa    Hypertension    IBS (irritable bowel syndrome)    PCOS (polycystic ovarian syndrome)    Post traumatic stress disorder (PTSD)     Family History  Problem Relation Age of Onset   Mental illness Mother    Cancer Mother        ovary   Endometriosis Mother    COPD Mother    Depression Mother    Miscarriages / Stillbirths Mother    Depression  Father    Irritable bowel syndrome Father    Anxiety disorder Father    Healthy Brother    Healthy Brother    COPD Maternal Grandmother    Cancer Maternal Grandmother        ovary   Depression Maternal Grandmother    Colon cancer Neg Hx    Rectal cancer Neg Hx    Esophageal cancer Neg Hx    Stomach cancer Neg Hx    Past Surgical History:  Procedure Laterality Date   COLONOSCOPY  08/2023   x2   UPPER GI ENDOSCOPY  2023   Social History   Social History Narrative   Not on file   Immunization History  Administered Date(s) Administered   DTaP 05/27/1998, 07/28/1998, 10/06/1998, 09/29/1999   H1N1 12/06/2007   HIB (PRP-OMP) 05/27/1998, 07/28/1998, 10/06/1998, 09/29/1999   Hepatitis B 05/27/1998, 07/28/1998, 10/06/1998   IPV 05/27/1998, 07/28/1998, 09/29/1999   Influenza Nasal 11/21/2007, 10/24/2008   Influenza Whole 10/23/2009, 12/07/2010,  11/15/2011   Influenza, Seasonal, Injecte, Preservative Fre 11/14/2012, 10/21/2023   MMR 03/31/1999   Pneumococcal Conjugate-13 03/31/1999   Tdap 09/22/2009   Varicella 03/31/1999     Objective: Vital Signs: BP 139/82 (BP Location: Left Arm, Patient Position: Sitting, Cuff Size: Small)   Pulse (!) 114   Temp 97.7 F (36.5 C)   Resp 13   Ht 5' 4 (1.626 m)   Wt 168 lb 3.2 oz (76.3 kg)   LMP 11/18/2023   BMI 28.87 kg/m    Physical Exam Eyes:     Conjunctiva/sclera: Conjunctivae normal.  Cardiovascular:     Rate and Rhythm: Normal rate and regular rhythm.  Pulmonary:     Effort: Pulmonary effort is normal.     Breath sounds: Normal breath sounds.  Skin:    General: Skin is warm and dry.  Neurological:     Mental Status: She is alert.  Psychiatric:        Mood and Affect: Mood normal.      Musculoskeletal Exam:  Neck full ROM no tenderness Shoulders full ROM no tenderness or swelling Elbows full ROM no tenderness or swelling Wrists full ROM no tenderness or swelling Fingers full ROM no tenderness or swelling Back pain tenderness to pressure across multiple areas midline and paraspinal muscles, palpable muscle knots, no radiation Hip normal internal and external rotation without pain, no tenderness to lateral hip palpation Knees full ROM no tenderness or swelling Ankles full ROM no tenderness or swelling  Investigation: No additional findings.  Imaging: No results found.  Recent Labs: Lab Results  Component Value Date   WBC 6.7 07/25/2023   HGB 14.6 07/25/2023   PLT 278.0 07/25/2023   NA 135 07/17/2023   K 3.7 07/17/2023   CL 107 07/17/2023   CO2 19 (L) 07/17/2023   GLUCOSE 124 (H) 07/17/2023   BUN 12 07/17/2023   CREATININE 0.74 07/17/2023   BILITOT 0.3 10/20/2023   ALKPHOS 41 10/20/2023   AST 16 10/20/2023   ALT 25 10/20/2023   PROT 8.1 10/20/2023   ALBUMIN 4.6 10/20/2023   CALCIUM 9.4 07/17/2023   GFRAA 148 01/11/2017    Speciality Comments:  No specialty comments available.  Procedures:  No procedures performed Allergies: Haldol [haloperidol], Reglan  [metoclopramide ], Tramadol , and Other   Assessment / Plan:     Visit Diagnoses: Positive ANA (antinuclear antibody) - Plan: hydroxychloroquine (PLAQUENIL) 200 MG tablet Fibromyalgia Chronic widespread pain with joint and muscle symptoms (Fibromyalgia syndrome) with elevated inflammatory markers. Chronic widespread pain  with elevated inflammatory markers suggests fibromyalgia syndrome with potential underlying inflammatory disorder. Not clear what portion of Sx are due to inflammatory process but will recommend HCQ trial given severity and persistent abnormal serology. - Start hydroxychloroquine 200 mg once daily. - Reassess symptoms and inflammatory markers in two months to evaluate effectiveness.  Alopecia (nonscarring hair loss) Significant hair loss.  Recurrent oral aphthae Recent occurrence of oral aphthae.  Irritable bowel syndrome (IBS) Abdominal pain with recent severe episode without diarrhea or constipation.  Hypertension Episodes of elevated blood pressure, particularly postprandial or end of day, with readings around 130/100 mmHg.   Orders: No orders of the defined types were placed in this encounter.  Meds ordered this encounter  Medications   hydroxychloroquine (PLAQUENIL) 200 MG tablet    Sig: Take 1 tablet (200 mg total) by mouth daily.    Dispense:  30 tablet    Refill:  2     Follow-Up Instructions: Return in about 2 months (around 01/28/2024) for +ANA/FMS HCQ trial f/u 2mos.   Cynthia LELON Ester, MD  Note - This record has been created using [employer].  Chart creation errors have been sought, but may not always  have been located. Such creation errors do not reflect on  the standard of medical care.

## 2023-11-28 ENCOUNTER — Ambulatory Visit: Attending: Internal Medicine | Admitting: Internal Medicine

## 2023-11-28 ENCOUNTER — Other Ambulatory Visit: Payer: Self-pay

## 2023-11-28 ENCOUNTER — Encounter: Payer: Self-pay | Admitting: Internal Medicine

## 2023-11-28 VITALS — BP 139/82 | HR 114 | Temp 97.7°F | Resp 13 | Ht 64.0 in | Wt 168.2 lb

## 2023-11-28 DIAGNOSIS — M797 Fibromyalgia: Secondary | ICD-10-CM

## 2023-11-28 DIAGNOSIS — R7689 Other specified abnormal immunological findings in serum: Secondary | ICD-10-CM

## 2023-11-28 MED ORDER — HYDROXYCHLOROQUINE SULFATE 200 MG PO TABS
200.0000 mg | ORAL_TABLET | Freq: Every day | ORAL | 2 refills | Status: AC
  Filled 2023-11-28: qty 30, 30d supply, fill #0
  Filled 2023-12-28: qty 30, 30d supply, fill #1
  Filled 2024-02-07: qty 30, 30d supply, fill #2

## 2023-11-28 NOTE — Patient Instructions (Signed)
 Cynthia Ross

## 2023-11-29 ENCOUNTER — Other Ambulatory Visit: Payer: Self-pay

## 2023-11-29 ENCOUNTER — Other Ambulatory Visit

## 2023-12-06 ENCOUNTER — Other Ambulatory Visit: Payer: Self-pay

## 2023-12-06 MED ORDER — LORAZEPAM 1 MG PO TABS
1.0000 mg | ORAL_TABLET | Freq: Two times a day (BID) | ORAL | 0 refills | Status: AC
  Filled 2023-12-06: qty 60, 30d supply, fill #0

## 2023-12-07 ENCOUNTER — Telehealth: Payer: Self-pay | Admitting: Gastroenterology

## 2023-12-07 ENCOUNTER — Other Ambulatory Visit: Payer: Self-pay

## 2023-12-07 DIAGNOSIS — R110 Nausea: Secondary | ICD-10-CM

## 2023-12-07 NOTE — Telephone Encounter (Signed)
 Inbound call from [place] with Nuclear medicine at [facility] requesting urine pregnancy order to [phone]. Please advise.   Thank you

## 2023-12-07 NOTE — Telephone Encounter (Signed)
 Order entered and faxed to [facility] at number provided.

## 2023-12-09 ENCOUNTER — Ambulatory Visit
Admission: RE | Admit: 2023-12-09 | Discharge: 2023-12-09 | Disposition: A | Discharge status: Home / Self Care | Source: Ambulatory Visit | Attending: Gastroenterology | Admitting: Gastroenterology

## 2023-12-09 DIAGNOSIS — R140 Abdominal distension (gaseous): Secondary | ICD-10-CM | POA: Insufficient documentation

## 2023-12-09 DIAGNOSIS — R6881 Early satiety: Secondary | ICD-10-CM | POA: Diagnosis present

## 2023-12-09 DIAGNOSIS — R110 Nausea: Secondary | ICD-10-CM | POA: Insufficient documentation

## 2023-12-09 MED ORDER — TECHNETIUM TC 99M SULFUR COLLOID
2.0900 | Freq: Once | INTRAVENOUS | Status: AC | PRN
  Administered 2023-12-09: 2.09 via ORAL

## 2023-12-11 ENCOUNTER — Other Ambulatory Visit: Payer: Self-pay

## 2023-12-12 ENCOUNTER — Other Ambulatory Visit: Payer: Self-pay

## 2023-12-12 ENCOUNTER — Ambulatory Visit: Admitting: Internal Medicine

## 2023-12-16 ENCOUNTER — Other Ambulatory Visit: Payer: Self-pay

## 2023-12-20 ENCOUNTER — Other Ambulatory Visit: Payer: Self-pay

## 2023-12-21 ENCOUNTER — Other Ambulatory Visit: Payer: Self-pay

## 2023-12-28 ENCOUNTER — Other Ambulatory Visit: Payer: Self-pay

## 2023-12-28 ENCOUNTER — Other Ambulatory Visit: Payer: Self-pay | Admitting: Physician Assistant

## 2023-12-28 DIAGNOSIS — R110 Nausea: Secondary | ICD-10-CM

## 2023-12-28 MED ORDER — ONDANSETRON HCL 4 MG PO TABS
4.0000 mg | ORAL_TABLET | ORAL | 1 refills | Status: AC | PRN
  Filled 2023-12-28: qty 60, 20d supply, fill #0
  Filled 2024-02-16: qty 60, 20d supply, fill #1

## 2023-12-30 ENCOUNTER — Other Ambulatory Visit: Payer: Self-pay

## 2023-12-30 MED ORDER — OXCARBAZEPINE 150 MG PO TABS
150.0000 mg | ORAL_TABLET | Freq: Every day | ORAL | 0 refills | Status: DC
  Filled 2023-12-30: qty 15, 15d supply, fill #0

## 2023-12-30 MED ORDER — PROPRANOLOL HCL 10 MG PO TABS
10.0000 mg | ORAL_TABLET | Freq: Two times a day (BID) | ORAL | 0 refills | Status: DC
  Filled 2023-12-30: qty 30, 15d supply, fill #0

## 2024-01-02 ENCOUNTER — Other Ambulatory Visit: Payer: Self-pay

## 2024-01-02 ENCOUNTER — Telehealth: Payer: Self-pay

## 2024-01-02 MED ORDER — BUPROPION HCL ER (XL) 150 MG PO TB24
150.0000 mg | ORAL_TABLET | Freq: Every morning | ORAL | 0 refills | Status: DC
  Filled 2024-01-02: qty 90, 90d supply, fill #0

## 2024-01-02 NOTE — Telephone Encounter (Signed)
 Patient due for LFTs. Order is in.  MyChart message to patient to go to the lab this week

## 2024-01-02 NOTE — Telephone Encounter (Signed)
-----   Message from CMA [place] [facility] sent at 10/21/2023 11:45 AM EDT ----- Leigh Elspeth SQUIBB, MD to Cynthia Ross, CMA (Selected Message)    10/20/23  6:09 PM Result Note Jan can you place a recall for repeat LFTs in 3 to 4 months.  Thanks

## 2024-01-04 ENCOUNTER — Other Ambulatory Visit: Payer: Self-pay

## 2024-01-04 ENCOUNTER — Encounter: Payer: Self-pay | Admitting: Gastroenterology

## 2024-01-04 MED ORDER — DICYCLOMINE HCL 10 MG PO CAPS
10.0000 mg | ORAL_CAPSULE | Freq: Three times a day (TID) | ORAL | 2 refills | Status: AC | PRN
  Filled 2024-01-04: qty 30, 10d supply, fill #0
  Filled 2024-02-16: qty 30, 10d supply, fill #1

## 2024-01-05 ENCOUNTER — Encounter: Admitting: Internal Medicine

## 2024-01-06 ENCOUNTER — Ambulatory Visit: Payer: Self-pay | Admitting: Gastroenterology

## 2024-01-06 ENCOUNTER — Other Ambulatory Visit (INDEPENDENT_AMBULATORY_CARE_PROVIDER_SITE_OTHER)

## 2024-01-06 ENCOUNTER — Other Ambulatory Visit: Payer: Self-pay

## 2024-01-06 DIAGNOSIS — R7989 Other specified abnormal findings of blood chemistry: Secondary | ICD-10-CM | POA: Diagnosis not present

## 2024-01-06 LAB — HEPATIC FUNCTION PANEL
ALT: 18 U/L (ref 0–35)
AST: 16 U/L (ref 0–37)
Albumin: 4.7 g/dL (ref 3.5–5.2)
Alkaline Phosphatase: 40 U/L (ref 39–117)
Bilirubin, Direct: 0 mg/dL (ref 0.0–0.3)
Total Bilirubin: 0.4 mg/dL (ref 0.2–1.2)
Total Protein: 7.5 g/dL (ref 6.0–8.3)

## 2024-01-06 MED ORDER — OMEPRAZOLE 40 MG PO CPDR
40.0000 mg | DELAYED_RELEASE_CAPSULE | Freq: Two times a day (BID) | ORAL | 1 refills | Status: AC | PRN
  Filled 2024-01-06 – 2024-02-14 (×2): qty 60, 30d supply, fill #0

## 2024-01-09 ENCOUNTER — Other Ambulatory Visit: Payer: Self-pay

## 2024-01-11 ENCOUNTER — Ambulatory Visit: Admitting: Physician Assistant

## 2024-01-16 ENCOUNTER — Other Ambulatory Visit: Payer: Self-pay

## 2024-01-17 ENCOUNTER — Other Ambulatory Visit: Payer: Self-pay

## 2024-01-21 ENCOUNTER — Other Ambulatory Visit: Payer: Self-pay

## 2024-01-27 ENCOUNTER — Other Ambulatory Visit: Payer: Self-pay

## 2024-01-27 MED ORDER — PROPRANOLOL HCL 10 MG PO TABS
10.0000 mg | ORAL_TABLET | Freq: Two times a day (BID) | ORAL | 0 refills | Status: DC
  Filled 2024-01-27: qty 30, 15d supply, fill #0

## 2024-01-27 MED ORDER — LORAZEPAM 1 MG PO TABS
1.0000 mg | ORAL_TABLET | Freq: Two times a day (BID) | ORAL | 0 refills | Status: AC
  Filled 2024-01-27: qty 60, 30d supply, fill #0

## 2024-01-28 ENCOUNTER — Encounter: Payer: Self-pay | Admitting: Gastroenterology

## 2024-01-31 ENCOUNTER — Other Ambulatory Visit: Payer: Self-pay

## 2024-01-31 MED ORDER — LINACLOTIDE 145 MCG PO CAPS
145.0000 ug | ORAL_CAPSULE | Freq: Every day | ORAL | 1 refills | Status: AC | PRN
  Filled 2024-01-31 – 2024-02-03 (×2): qty 30, 30d supply, fill #0

## 2024-01-31 NOTE — Therapy (Incomplete)
 " OUTPATIENT PHYSICAL THERAPY FEMALE PELVIC EVALUATION   Patient Name: Cynthia Ross MRN: [id number] DOB:[birth year], 24 y.o., female Today's Date: 01/31/2024  END OF SESSION:   Past Medical History:  Diagnosis Date   Anemia    Anxiety    Bipolar depression (HCC)    Borderline personality disorder (HCC)    Depression    GERD (gastroesophageal reflux disease)    Hidradenitis suppurativa    Hypertension    IBS (irritable bowel syndrome)    PCOS (polycystic ovarian syndrome)    Post traumatic stress disorder (PTSD)    Past Surgical History:  Procedure Laterality Date   COLONOSCOPY  08/2023   x2   UPPER GI ENDOSCOPY  2023   Patient Active Problem List   Diagnosis Date Noted   Fibromyalgia 11/28/2023   Positive ANA (antinuclear antibody) 11/10/2023   Chronic post-traumatic stress disorder (PTSD) 11/10/2023   Borderline personality disorder (HCC) 11/10/2023   PCOS (polycystic ovarian syndrome) 11/10/2023   Prediabetes 11/10/2023   IDA (iron  deficiency anemia) 09/21/2022   Fatigue 09/21/2022   Abdominal pain of multiple sites 03/11/2017   Irritable bowel syndrome with both constipation and diarrhea 12/14/2016   Depression 09/22/2012   Generalized anxiety disorder 09/22/2012   Family history of bipolar disorder 09/22/2012   Family history of depression 09/22/2012   Dyspepsia 09/08/2012   GERD (gastroesophageal reflux disease) 09/08/2012    PCP: Cleotilde, [place]  E, PA  REFERRING PROVIDER: Leigh Elspeth SQUIBB, MD  REFERRING DIAG: K59.00 (ICD-10-CM) - Constipation, unspecified constipation type  THERAPY DIAG:  No diagnosis found.  Rationale for Evaluation and Treatment: Rehabilitation   ONSET DATE: ***   SUBJECTIVE:                                                                                                                                                                                            SUBJECTIVE STATEMENT:   PAIN:  Are you having pain?  {yes/no:20286} NPRS scale: ***/10 Pain location: {pelvic pain location:27098}   Pain type: {type:313116} Pain description: {PAIN DESCRIPTION:21022940}    Aggravating factors: *** Relieving factors: ***   PRECAUTIONS: None   RED FLAGS: None       WEIGHT BEARING RESTRICTIONS: No   FALLS:  Has patient fallen in last 6 months? No   OCCUPATION: ***   ACTIVITY LEVEL : ***   PLOF: Independent   PATIENT GOALS: ***   PERTINENT HISTORY:  ***   BOWEL MOVEMENT: Pain with bowel movement: {yes/no:20286} Type of bowel movement:{PT BM type:27100} Fully empty rectum: {No/Yes:304960894} Leakage: {Yes/No:304960894} Urgency: {Yes/No:304960894} Pads: {Yes/No:304960894} Fiber supplement/laxative {YES/NO AS:20300}   URINATION: Pain with urination: {yes/no:20286}  Fully empty bladder: {Yes/No:304960894} Stream: {PT urination:27102} Urgency: {YES/NO AS:20300} Frequency: *** Nocturia: *** Fluid Intake: *** Leakage: {PT leakage:27103} Pads: {Yes/No:304960894}   INTERCOURSE:             Ability to have vaginal penetration {YES/NO:21197} Pain with intercourse: {pain with intercourse PA:27099} Dryness{YES/NO AS:20300} Climax: *** Marinoff Scale: ***/3 Lubricant: ***   PREGNANCY: Vaginal deliveries *** Tearing {Yes***/No:304960894} Episiotomy {YES/NO AS:20300} C-section deliveries *** Currently pregnant {Yes***/No:304960894}   PROLAPSE: {PT prolapse:27101}     OBJECTIVE:  Note: Objective measures were completed at Evaluation unless otherwise noted.   12/29/23 PATIENT SURVEYS:    PFIQ-7: ***   COGNITION: Overall cognitive status: Within functional limits for tasks assessed                          SENSATION: Light touch: Appears intact     FUNCTIONAL TESTS:  Squat: *** Single leg stance:             Rt: ***             Lt: *** Curl-up test: *** Sit-up test: *** Active straight leg raise: ***     GAIT: Assistive device utilized: None Comments: WNL    POSTURE: {posture:25561}     LUMBARAROM/PROM:   A/PROM A/PROM  Eval (% available)  Flexion    Extension    Right lateral flexion    Left lateral flexion    Right rotation    Left rotation     (Blank rows = not tested)   PALPATION: General: ***   Abdominal: Breathing: *** Tenderness: *** Scar tissue: *** Diastasis: ***                 External Perineal Exam: ***                             Internal Pelvic Floor: ***   Patient confirms identification and approves PT to assess internal pelvic floor and treatment {yes/no:20286}   PELVIC MMT:   MMT eval  Vaginal    Internal Anal Sphincter    External Anal Sphincter    Puborectalis    (Blank rows = not tested)         TONE: ***   PROLAPSE: ***   TODAY'S TREATMENT:                                                                                                                              DATE:   EVAL  Manual:   Neuromuscular re-education:   Exercises:   Therapeutic activities:         PATIENT EDUCATION:  Education details: See above Person educated: Patient Education method: Explanation, Demonstration, Tactile cues, Verbal cues, and Handouts Education comprehension: verbalized understanding   HOME EXERCISE PROGRAM: ***   ASSESSMENT:   CLINICAL IMPRESSION: Patient is a ***  who was seen today for  physical therapy evaluation and treatment for ***.    OBJECTIVE IMPAIRMENTS: decreased activity tolerance, decreased coordination, decreased endurance, decreased mobility, decreased ROM, decreased strength, increased fascial restrictions, increased muscle spasms, impaired flexibility, impaired tone, improper body mechanics, postural dysfunction, and pain.    ACTIVITY LIMITATIONS: {activitylimitations:27494}   PARTICIPATION LIMITATIONS: {participationrestrictions:25113}   PERSONAL FACTORS: {Personal factors:25162} are also affecting patient's functional outcome.    REHAB POTENTIAL: Good   CLINICAL  DECISION MAKING: Stable/uncomplicated   EVALUATION COMPLEXITY: Low     GOALS: Goals reviewed with patient? Yes   SHORT TERM GOALS: Target date: 02/28/2024       Pt will be independent with HEP in order to improve activity tolerance.    Baseline: Goal status: INITIAL   2.  ***   Baseline:  Goal status: INITIAL   3.  ***   Baseline:  Goal status: INITIAL   4.  *** Baseline:  Goal status: INITIAL   5.  *** Baseline:  Goal status: INITIAL   6.  *** Baseline:  Goal status: INITIAL   LONG TERM GOALS: Target date:    Pt will be independent with advanced HEP in order to improve activity tolerance.    Baseline:  Goal status: INITIAL   2.  *** Baseline:  Goal status: INITIAL   3.  *** Baseline:  Goal status: INITIAL   4.  *** Baseline:  Goal status: INITIAL   5.  *** Baseline:  Goal status: INITIAL   6.  *** Baseline:  Goal status: INITIAL   PLAN:   PT FREQUENCY: 1-2x/week   PT DURATION: ***    PLANNED INTERVENTIONS: 97164- PT Re-evaluation, 97110-Therapeutic exercises, 97530- Therapeutic activity, 97112- Neuromuscular re-education, 97535- Self Care, 02859- Manual therapy, [id number]- Gait training, [id number]- Aquatic Therapy, H9716- Electrical stimulation (unattended), [id number]- Traction (mechanical), [id number]- Ionotophoresis 4mg /ml Dexamethasone , 79439 (1-2 muscles), 20561 (3+ muscles)- Dry Needling, Patient/Family education, Balance training, Taping, Joint mobilization, Joint manipulation, Spinal manipulation, Spinal mobilization, Scar mobilization, Vestibular training, Cryotherapy, Moist heat, and Biofeedback   PLAN FOR NEXT SESSION: ***   Shawanda Sievert, PT, DPT 01/31/2024 5:14 PM  [facility] [address], [address] [place], [place] [address] Phone # [phone] Fax [phone]   Abubakr Wieman, PT 01/31/2024, 5:14 PM  "

## 2024-02-01 ENCOUNTER — Ambulatory Visit: Admitting: Physical Therapy

## 2024-02-02 ENCOUNTER — Ambulatory Visit: Admitting: Internal Medicine

## 2024-02-03 ENCOUNTER — Other Ambulatory Visit: Payer: Self-pay
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
## 2024-02-07 ENCOUNTER — Encounter: Payer: Self-pay | Admitting: Physical Therapy

## 2024-02-07 ENCOUNTER — Other Ambulatory Visit: Payer: Self-pay

## 2024-02-07 ENCOUNTER — Ambulatory Visit: Attending: Internal Medicine | Admitting: Physical Therapy

## 2024-02-07 DIAGNOSIS — K5900 Constipation, unspecified: Secondary | ICD-10-CM | POA: Diagnosis not present

## 2024-02-07 DIAGNOSIS — R252 Cramp and spasm: Secondary | ICD-10-CM | POA: Diagnosis present

## 2024-02-09 ENCOUNTER — Other Ambulatory Visit: Payer: Self-pay

## 2024-02-09 MED ORDER — OXCARBAZEPINE 150 MG PO TABS
150.0000 mg | ORAL_TABLET | Freq: Every day | ORAL | 0 refills | Status: AC
  Filled 2024-02-09: qty 30, 30d supply, fill #0

## 2024-02-09 MED ORDER — BUPROPION HCL ER (XL) 150 MG PO TB24
150.0000 mg | ORAL_TABLET | Freq: Every morning | ORAL | 0 refills | Status: AC
  Filled 2024-02-09: qty 90, 90d supply, fill #0

## 2024-02-09 MED ORDER — PROPRANOLOL HCL 10 MG PO TABS
10.0000 mg | ORAL_TABLET | Freq: Two times a day (BID) | ORAL | 0 refills | Status: AC
  Filled 2024-02-09: qty 60, 30d supply, fill #0

## 2024-02-10 ENCOUNTER — Other Ambulatory Visit: Payer: Self-pay

## 2024-02-10 MED ORDER — BUPROPION HCL ER (XL) 300 MG PO TB24
300.0000 mg | ORAL_TABLET | Freq: Every day | ORAL | 0 refills | Status: AC
  Filled 2024-02-14: qty 90, 90d supply, fill #0

## 2024-02-13 ENCOUNTER — Other Ambulatory Visit: Payer: Self-pay
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
## 2024-02-14 ENCOUNTER — Encounter: Payer: Self-pay | Admitting: Physical Therapy

## 2024-02-14 ENCOUNTER — Other Ambulatory Visit: Payer: Self-pay

## 2024-02-14 ENCOUNTER — Ambulatory Visit: Admitting: Physical Therapy

## 2024-02-14 DIAGNOSIS — R252 Cramp and spasm: Secondary | ICD-10-CM

## 2024-02-16 ENCOUNTER — Other Ambulatory Visit: Payer: Self-pay

## 2024-02-17 ENCOUNTER — Other Ambulatory Visit: Payer: Self-pay

## 2024-02-17 NOTE — Assessment & Plan Note (Deleted)
" °  Orders:   CBC with Differential/Platelet   Comprehensive metabolic panel with GFR   C3 and C4  "

## 2024-02-20 ENCOUNTER — Ambulatory Visit: Admitting: Physical Therapy

## 2024-02-27 ENCOUNTER — Ambulatory Visit: Payer: Self-pay | Admitting: Physical Therapy

## 2024-02-29 ENCOUNTER — Ambulatory Visit: Admitting: Internal Medicine

## 2024-02-29 ENCOUNTER — Encounter: Payer: Self-pay | Admitting: Internal Medicine

## 2024-02-29 VITALS — BP 99/67 | HR 91 | Temp 97.5°F | Resp 14 | Ht 64.0 in | Wt 180.0 lb

## 2024-02-29 DIAGNOSIS — R7689 Other specified abnormal immunological findings in serum: Secondary | ICD-10-CM

## 2024-02-29 DIAGNOSIS — M797 Fibromyalgia: Secondary | ICD-10-CM

## 2024-02-29 DIAGNOSIS — M359 Systemic involvement of connective tissue, unspecified: Secondary | ICD-10-CM

## 2024-02-29 NOTE — Assessment & Plan Note (Signed)
 Symptoms include joint and muscle pain, hair loss, and mouth ulcers. Initial response to hydroxychloroquine , but efficacy plateaued after missed doses. Elevated complement C3 indicates persistent inflammation. Differential includes seronegative inflammatory arthritis and connective tissue disease. Partial improvement with hydroxychloroquine  suggests potential benefit from dose adjustment. - Rechecked blood tests to assess inflammatory markers. - Consider increasing hydroxychloroquine  dose to 5 mg/kg based on weight if partial improvement is noted in blood tests.  Orders:   CBC with Differential/Platelet   Comprehensive metabolic panel with GFR   C3 and C4

## 2024-02-29 NOTE — Patient Instructions (Signed)
 I recommend checking out the [place] [facility] [place] patient-centered guide for fibromyalgia and chronic pain management: [email or web address]

## 2024-03-01 ENCOUNTER — Other Ambulatory Visit: Payer: Self-pay

## 2024-03-01 LAB — CBC WITH DIFFERENTIAL/PLATELET
Absolute Lymphocytes: 1959 {cells}/uL (ref 850–3900)
Absolute Monocytes: 319 {cells}/uL (ref 200–950)
Basophils Absolute: 30 {cells}/uL (ref 0–200)
Basophils Relative: 0.5 %
Eosinophils Absolute: 89 {cells}/uL (ref 15–500)
Eosinophils Relative: 1.5 %
HCT: 43.4 % (ref 35.9–46.0)
Hemoglobin: 14.2 g/dL (ref 11.7–15.5)
MCH: 27.8 pg (ref 27.0–33.0)
MCHC: 32.7 g/dL (ref 31.6–35.4)
MCV: 85.1 fL (ref 81.4–101.7)
MPV: 10.6 fL (ref 7.5–12.5)
Monocytes Relative: 5.4 %
Neutro Abs: 3505 {cells}/uL (ref 1500–7800)
Neutrophils Relative %: 59.4 %
Platelets: 248 10*3/uL (ref 140–400)
RBC: 5.1 Million/uL (ref 3.80–5.10)
RDW: 13.1 % (ref 11.0–15.0)
Total Lymphocyte: 33.2 %
WBC: 5.9 10*3/uL (ref 3.8–10.8)

## 2024-03-01 LAB — COMPREHENSIVE METABOLIC PANEL WITH GFR
AG Ratio: 1.9 (calc) (ref 1.0–2.5)
ALT: 15 U/L (ref 6–29)
AST: 14 U/L (ref 10–30)
Albumin: 4.7 g/dL (ref 3.6–5.1)
Alkaline phosphatase (APISO): 40 U/L (ref 31–125)
BUN: 11 mg/dL (ref 7–25)
CO2: 26 mmol/L (ref 20–32)
Calcium: 9.5 mg/dL (ref 8.6–10.2)
Chloride: 104 mmol/L (ref 98–110)
Creat: 0.88 mg/dL (ref 0.50–0.96)
Globulin: 2.5 g/dL (ref 1.9–3.7)
Glucose, Bld: 90 mg/dL (ref 65–99)
Potassium: 4.2 mmol/L (ref 3.5–5.3)
Sodium: 138 mmol/L (ref 135–146)
Total Bilirubin: 0.4 mg/dL (ref 0.2–1.2)
Total Protein: 7.2 g/dL (ref 6.1–8.1)
eGFR: 93 mL/min/{1.73_m2}

## 2024-03-01 LAB — C3 AND C4
C3 Complement: 179 mg/dL (ref 83–193)
C4 Complement: 12 mg/dL — ABNORMAL LOW (ref 15–57)

## 2024-03-01 MED ORDER — PRUCALOPRIDE SUCCINATE 2 MG PO TABS
2.0000 mg | ORAL_TABLET | Freq: Every day | ORAL | 3 refills | Status: AC
  Filled 2024-03-01: qty 30, 30d supply, fill #0

## 2024-03-02 ENCOUNTER — Other Ambulatory Visit: Payer: Self-pay

## 2024-03-02 MED ORDER — SCOPOLAMINE 1 MG/3DAYS TD PT72
MEDICATED_PATCH | TRANSDERMAL | 0 refills | Status: AC
  Filled 2024-03-02: qty 3, 9d supply, fill #0

## 2024-03-05 ENCOUNTER — Ambulatory Visit: Admitting: Physical Therapy

## 2024-03-12 ENCOUNTER — Ambulatory Visit: Admitting: Physical Therapy

## 2024-03-19 ENCOUNTER — Ambulatory Visit: Admitting: Physical Therapy

## 2024-05-30 ENCOUNTER — Ambulatory Visit: Admitting: Internal Medicine
